# Patient Record
Sex: Female | Born: 1960 | Race: Black or African American | Hispanic: No | Smoking: Former smoker
Health system: Southern US, Community
[De-identification: ages and names within clinical notes are randomized; demographics above are authoritative.]

## PROBLEM LIST (undated history)

## (undated) DIAGNOSIS — D649 Anemia, unspecified: Secondary | ICD-10-CM

## (undated) DIAGNOSIS — I10 Essential (primary) hypertension: Secondary | ICD-10-CM

## (undated) DIAGNOSIS — D219 Benign neoplasm of connective and other soft tissue, unspecified: Secondary | ICD-10-CM

## (undated) HISTORY — PX: CHOLECYSTECTOMY: SHX55

## (undated) HISTORY — PX: APPENDECTOMY: SHX54

## (undated) HISTORY — PX: SHOULDER SURGERY: SHX246

## (undated) HISTORY — PX: COLONOSCOPY: SHX5424

---

## 2008-10-14 ENCOUNTER — Emergency Department (HOSPITAL_COMMUNITY): Admission: EM | Admit: 2008-10-14 | Discharge: 2008-10-14 | Payer: Self-pay | Admitting: Emergency Medicine

## 2008-10-15 ENCOUNTER — Encounter (INDEPENDENT_AMBULATORY_CARE_PROVIDER_SITE_OTHER): Payer: Self-pay | Admitting: *Deleted

## 2010-06-18 LAB — BASIC METABOLIC PANEL
BUN: 7 mg/dL (ref 6–23)
Creatinine, Ser: 0.95 mg/dL (ref 0.4–1.2)
GFR calc non Af Amer: >60 mL/min (ref >60)
Glucose, Bld: 113 mg/dL — ABNORMAL HIGH (ref 70–99)
Potassium: 3.3 mEq/L — ABNORMAL LOW (ref 3.5–5.1)

## 2010-06-18 LAB — CLOSTRIDIUM DIFFICILE EIA: C difficile Toxins A+B, EIA: NEGATIVE

## 2010-06-18 LAB — DIFFERENTIAL
Basophils Absolute: 0 10*3/uL (ref 0.0–0.1)
Eosinophils Absolute: 0 10*3/uL (ref 0.0–0.7)
Eosinophils Relative: 0 % (ref 0–5)
Lymphocytes Relative: 14 % (ref 12–46)
Neutrophils Relative %: 74 % (ref 43–77)

## 2010-06-18 LAB — URINE MICROSCOPIC-ADD ON

## 2010-06-18 LAB — URINALYSIS, ROUTINE W REFLEX MICROSCOPIC
Nitrite: POSITIVE — AB
Specific Gravity, Urine: >1.03 — ABNORMAL HIGH (ref 1.005–1.030)
Urobilinogen, UA: 0.2 mg/dL (ref 0.0–1.0)

## 2010-06-18 LAB — CBC
HCT: 40.3 % (ref 36.0–46.0)
Platelets: 168 10*3/uL (ref 150–400)
RDW: 12.8 % (ref 11.5–15.5)

## 2010-06-18 LAB — URINE CULTURE: Colony Count: 30000

## 2010-06-18 LAB — HEPATIC FUNCTION PANEL
ALT: 39 U/L — ABNORMAL HIGH (ref 0–35)
Albumin: 3.6 g/dL (ref 3.5–5.2)
Indirect Bilirubin: 0.5 mg/dL (ref 0.3–0.9)
Total Protein: 6.6 g/dL (ref 6.0–8.3)

## 2010-06-18 LAB — STOOL CULTURE

## 2010-06-18 LAB — LIPASE, BLOOD: Lipase: 13 U/L (ref 11–59)

## 2016-06-28 ENCOUNTER — Other Ambulatory Visit: Payer: Self-pay | Admitting: Obstetrics and Gynecology

## 2016-06-28 DIAGNOSIS — R928 Other abnormal and inconclusive findings on diagnostic imaging of breast: Secondary | ICD-10-CM

## 2016-06-30 ENCOUNTER — Ambulatory Visit
Admission: RE | Admit: 2016-06-30 | Discharge: 2016-06-30 | Disposition: A | Discharge status: Home / Self Care | Payer: BC Managed Care – PPO | Source: Ambulatory Visit | Attending: Obstetrics and Gynecology | Admitting: Obstetrics and Gynecology

## 2016-06-30 DIAGNOSIS — R928 Other abnormal and inconclusive findings on diagnostic imaging of breast: Secondary | ICD-10-CM

## 2017-04-13 ENCOUNTER — Telehealth: Payer: Self-pay | Admitting: Orthopedic Surgery

## 2017-04-13 NOTE — Telephone Encounter (Signed)
Patient called to inquire about appointment as a new patient, for bilateral shoulder problem; states seeing chiropractor (during past 3 months, with some relief, "then problem comes back again". Discussed appointment availability, and that notes regarding her recent treatment would be needed.  I had to call patient back, due to avoiding hold time for patient, received her voice mail, left message to return call.

## 2018-02-28 NOTE — Patient Instructions (Addendum)
Your procedure is scheduled on:  Monday, 12/30  Enter through the Main Entrance of [facility] at: 6 am  Pick up the phone at the desk and dial [phone].  Call this number if you have problems the morning of surgery: [phone].  Remember: Do NOT eat food or Do NOT drink clear liquids (including water) after midnight Sunday.  Take these medicines the morning of surgery with a SIP OF WATER: None  Brush your teeth on the day of surgery.  Stop herbal medications, vitamin supplements, Ibuprofen/NSAIDS at this time..  Do NOT wear jewelry (body piercing), metal hair clips/bobby pins, make-up, or nail polish. Do NOT wear lotions, powders, or perfumes.  You may wear deoderant. Do NOT shave for 48 hours prior to surgery. Do NOT bring valuables to the hospital.  Leave suitcase in car.  After surgery it may be brought to your room.  For patients admitted to the hospital, checkout time is 11:00 AM the day of discharge. Have a responsible adult drive you home and stay with you for 24 hours after your procedure.  Home with Daughter Rachel Dunn cell [phone].

## 2018-03-01 ENCOUNTER — Other Ambulatory Visit: Payer: Self-pay

## 2018-03-01 ENCOUNTER — Encounter (HOSPITAL_COMMUNITY): Payer: Self-pay

## 2018-03-01 ENCOUNTER — Encounter (HOSPITAL_COMMUNITY)
Admission: RE | Admit: 2018-03-01 | Discharge: 2018-03-01 | Disposition: A | Discharge status: Home / Self Care | Payer: BC Managed Care – PPO | Source: Ambulatory Visit | Attending: Obstetrics and Gynecology | Admitting: Obstetrics and Gynecology

## 2018-03-01 DIAGNOSIS — Z01812 Encounter for preprocedural laboratory examination: Secondary | ICD-10-CM | POA: Diagnosis not present

## 2018-03-01 HISTORY — DX: Benign neoplasm of connective and other soft tissue, unspecified: D21.9

## 2018-03-01 HISTORY — DX: Anemia, unspecified: D64.9

## 2018-03-01 HISTORY — DX: Essential (primary) hypertension: I10

## 2018-03-01 LAB — COMPREHENSIVE METABOLIC PANEL
ALT: 18 U/L (ref 0–44)
AST: 11 U/L — ABNORMAL LOW (ref 15–41)
Albumin: 4.2 g/dL (ref 3.5–5.0)
Alkaline Phosphatase: 76 U/L (ref 38–126)
Anion gap: 6 (ref 5–15)
BUN: 10 mg/dL (ref 6–20)
CO2: 22 mmol/L (ref 22–32)
Calcium: 9.3 mg/dL (ref 8.9–10.3)
Chloride: 108 mmol/L (ref 98–111)
Creatinine, Ser: 0.64 mg/dL (ref 0.44–1.00)
GFR calc non Af Amer: >60 mL/min (ref >60)
GLUCOSE: 92 mg/dL (ref 70–99)
Potassium: 3.8 mmol/L (ref 3.5–5.1)
Sodium: 136 mmol/L (ref 135–145)
Total Bilirubin: 0.7 mg/dL (ref 0.3–1.2)
Total Protein: 7 g/dL (ref 6.5–8.1)

## 2018-03-01 LAB — CBC
HCT: 32 % — ABNORMAL LOW (ref 36.0–46.0)
HEMOGLOBIN: 9.6 g/dL — AB (ref 12.0–15.0)
MCH: 23.5 pg — ABNORMAL LOW (ref 26.0–34.0)
MCHC: 30 g/dL (ref 30.0–36.0)
MCV: 78.4 fL — ABNORMAL LOW (ref 80.0–100.0)
Platelets: 204 10*3/uL (ref 150–400)
RBC: 4.08 MIL/uL (ref 3.87–5.11)
RDW: 14.2 % (ref 11.5–15.5)
WBC: 6.3 10*3/uL (ref 4.0–10.5)
nRBC: 0 % (ref 0.0–0.2)

## 2018-03-01 LAB — ABO/RH: ABO/RH(D): A POS

## 2018-03-01 LAB — TYPE AND SCREEN
ABO/RH(D): A POS
Antibody Screen: NEGATIVE

## 2018-03-10 NOTE — H&P (Addendum)
Rachel Dunn is an 57 y.o. female presents today for hysterectomy.  She has been having problems with menorrhagia and dysmenorrhea.  She does have a 12-week-sized fibroid uterus with a recent ultrasound showing 6.5 and 5.2 cm intramural fibroids distorting the endometrial cavity but there are no intracavitary masses noted.  She is perimenopausal and desires removal of both ovaries since she is 57 years old.  Previously has been on birth control pills but she stopped and cycles have been excessive and at this time desires surgical intervention.        Past Medical History:  Diagnosis Date  . Anemia   . Fibroids    uterine  . Hypertension     Past Surgical History:  Procedure Laterality Date  . APPENDECTOMY    . CESAREAN SECTION    . CHOLECYSTECTOMY    . COLONOSCOPY     hx polyp  . SHOULDER SURGERY Right     Family History  Problem Relation Age of Onset  . Breast cancer Maternal Aunt     Social History:  reports that she quit smoking about 21 years ago. Her smoking use included cigarettes. She has a 0.50 pack-year smoking history. She has never used smokeless tobacco. She reports current alcohol use of about 3.0 standard drinks of alcohol per week. She reports that she does not use drugs.  Allergies: No Known Allergies  No medications prior to admission.    ROS  There were no vitals taken for this visit. Physical Exam Blood pressure 124/82. Heart regular rate and rhythm.  Lungs clear to auscultation bilaterally.  Abdomen is nondistended and nontender.  Pfannenstiel skin incision well healed.  Uterus is anteverted, 12 weeks size, mobile, nontender.  No adnexal masses.   No results found for this or any previous visit (from the past 24 hour(s)).  No results found. Imp/Plan:   Uterine fibroids 12 weeks size and menorrhagia with dysmenorrhea desiring definitive surgical intervention, also with a previous history of cesarean section.  Discussed hysterectomy.  We will proceed  with an LAVH/BSO.  She understands we may need to convert to an abdominal hysterectomy.  We discussed the procedure, its risks and benefits, its recovery.  All of her questions were answered.   Louretta Shorten, MD   Luz Lex 03/10/2018, 9:31 PM   This patient has been seen and examined.   All of her questions were answered.  Labs and vital signs reviewed.  Informed consent has been obtained.  The History and Physical is current. DL

## 2018-03-10 NOTE — Anesthesia Preprocedure Evaluation (Addendum)
Anesthesia Evaluation  Patient identified by MRN, date of birth, ID band Patient awake    Reviewed: Allergy & Precautions, NPO status , Patient's Chart, lab work & pertinent test results  Airway Mallampati: I  TM Distance: >3 FB Neck ROM: Full    Dental no notable dental hx. (+) Teeth Intact, Dental Advisory Given   Pulmonary former smoker,    Pulmonary exam normal breath sounds clear to auscultation       Cardiovascular hypertension, Pt. on medications Normal cardiovascular exam Rhythm:Regular Rate:Normal     Neuro/Psych negative neurological ROS  negative psych ROS   GI/Hepatic negative GI ROS, Neg liver ROS,   Endo/Other  negative endocrine ROS  Renal/GU negative Renal ROS     Musculoskeletal negative musculoskeletal ROS (+)   Abdominal (+) + obese,   Peds  Hematology  (+) Blood dyscrasia, anemia ,   Anesthesia Other Findings   Reproductive/Obstetrics negative OB ROS                            Lab Results  Component Value Date   CREATININE 0.64 03/01/2018   BUN 10 03/01/2018   NA 136 03/01/2018   K 3.8 03/01/2018   CL 108 03/01/2018   CO2 22 03/01/2018    Lab Results  Component Value Date   WBC 6.3 03/01/2018   HGB 9.6 (L) 03/01/2018   HCT 32.0 (L) 03/01/2018   MCV 78.4 (L) 03/01/2018   PLT 204 03/01/2018    Anesthesia Physical Anesthesia Plan  ASA: III  Anesthesia Plan: General   Post-op Pain Management:    Induction: Intravenous  PONV Risk Score and Plan: 3 and Treatment may vary due to age or medical condition, Ondansetron and Dexamethasone  Airway Management Planned: Oral ETT  Additional Equipment:   Intra-op Plan:   Post-operative Plan: Extubation in OR  Informed Consent: I have reviewed the patients History and Physical, chart, labs and discussed the procedure including the risks, benefits and alternatives for the proposed anesthesia with the patient  or authorized representative who has indicated his/her understanding and acceptance.   Dental advisory given  Plan Discussed with: CRNA  Anesthesia Plan Comments:        Anesthesia Quick Evaluation

## 2018-03-11 ENCOUNTER — Observation Stay (HOSPITAL_COMMUNITY)
Admission: RE | Admit: 2018-03-11 | Discharge: 2018-03-12 | Disposition: A | Discharge status: Home / Self Care | Payer: BC Managed Care – PPO | Attending: Obstetrics and Gynecology | Admitting: Obstetrics and Gynecology

## 2018-03-11 ENCOUNTER — Encounter (HOSPITAL_COMMUNITY): Payer: Self-pay

## 2018-03-11 ENCOUNTER — Observation Stay (HOSPITAL_COMMUNITY): Payer: BC Managed Care – PPO | Admitting: Anesthesiology

## 2018-03-11 ENCOUNTER — Other Ambulatory Visit: Payer: Self-pay

## 2018-03-11 ENCOUNTER — Encounter (HOSPITAL_COMMUNITY)
Admission: RE | Disposition: A | Discharge status: Home / Self Care | Payer: Self-pay | Source: Home / Self Care | Attending: Obstetrics and Gynecology

## 2018-03-11 DIAGNOSIS — N8302 Follicular cyst of left ovary: Secondary | ICD-10-CM | POA: Diagnosis not present

## 2018-03-11 DIAGNOSIS — D251 Intramural leiomyoma of uterus: Secondary | ICD-10-CM | POA: Diagnosis present

## 2018-03-11 DIAGNOSIS — Z79899 Other long term (current) drug therapy: Secondary | ICD-10-CM | POA: Diagnosis not present

## 2018-03-11 DIAGNOSIS — Z90710 Acquired absence of both cervix and uterus: Secondary | ICD-10-CM | POA: Diagnosis present

## 2018-03-11 DIAGNOSIS — Z87891 Personal history of nicotine dependence: Secondary | ICD-10-CM | POA: Diagnosis not present

## 2018-03-11 DIAGNOSIS — N736 Female pelvic peritoneal adhesions (postinfective): Secondary | ICD-10-CM | POA: Insufficient documentation

## 2018-03-11 DIAGNOSIS — R102 Pelvic and perineal pain: Secondary | ICD-10-CM | POA: Diagnosis not present

## 2018-03-11 DIAGNOSIS — N8301 Follicular cyst of right ovary: Secondary | ICD-10-CM | POA: Diagnosis not present

## 2018-03-11 DIAGNOSIS — N920 Excessive and frequent menstruation with regular cycle: Secondary | ICD-10-CM | POA: Diagnosis present

## 2018-03-11 DIAGNOSIS — I10 Essential (primary) hypertension: Secondary | ICD-10-CM | POA: Diagnosis not present

## 2018-03-11 DIAGNOSIS — N946 Dysmenorrhea, unspecified: Secondary | ICD-10-CM | POA: Insufficient documentation

## 2018-03-11 HISTORY — PX: LAPAROSCOPY: SHX197

## 2018-03-11 HISTORY — PX: HYSTERECTOMY ABDOMINAL WITH SALPINGO-OOPHORECTOMY: SHX6792

## 2018-03-11 HISTORY — PX: LYSIS OF ADHESION: SHX5961

## 2018-03-11 SURGERY — LAPAROSCOPY, DIAGNOSTIC
Anesthesia: General

## 2018-03-11 MED ORDER — HYDROMORPHONE HCL 1 MG/ML IJ SOLN
0.2500 mg | INTRAMUSCULAR | Status: DC | PRN
  Administered 2018-03-11 (×2): 0.5 mg via INTRAVENOUS

## 2018-03-11 MED ORDER — ACETAMINOPHEN 500 MG PO TABS
ORAL_TABLET | ORAL | Status: AC
  Administered 2018-03-11: 1000 mg via ORAL
  Filled 2018-03-11: qty 2

## 2018-03-11 MED ORDER — HYDROMORPHONE 1 MG/ML IV SOLN
INTRAVENOUS | Status: DC
  Administered 2018-03-11: 0.2 mg via INTRAVENOUS
  Administered 2018-03-11: 1.8 mg via INTRAVENOUS
  Administered 2018-03-11: 30 mg via INTRAVENOUS
  Filled 2018-03-11: qty 30
  Filled 2018-03-11: qty 25

## 2018-03-11 MED ORDER — LIDOCAINE HCL (CARDIAC) PF 100 MG/5ML IV SOSY
PREFILLED_SYRINGE | INTRAVENOUS | Status: DC | PRN
  Administered 2018-03-11: 100 mg via INTRAVENOUS

## 2018-03-11 MED ORDER — ROCURONIUM BROMIDE 100 MG/10ML IV SOLN
INTRAVENOUS | Status: AC
  Filled 2018-03-11: qty 1

## 2018-03-11 MED ORDER — PROMETHAZINE HCL 25 MG/ML IJ SOLN: 6.2500 mg | INTRAMUSCULAR | Status: DC | PRN

## 2018-03-11 MED ORDER — HYDROCODONE-ACETAMINOPHEN 7.5-325 MG PO TABS: 1.0000 | ORAL_TABLET | Freq: Once | ORAL | Status: DC | PRN

## 2018-03-11 MED ORDER — BUPIVACAINE HCL (PF) 0.25 % IJ SOLN
INTRAMUSCULAR | Status: AC
  Filled 2018-03-11: qty 30

## 2018-03-11 MED ORDER — DIPHENHYDRAMINE HCL 12.5 MG/5ML PO ELIX: 12.5000 mg | ORAL_SOLUTION | Freq: Four times a day (QID) | ORAL | Status: DC | PRN

## 2018-03-11 MED ORDER — HYDROMORPHONE HCL 1 MG/ML IJ SOLN
INTRAMUSCULAR | Status: DC | PRN
  Administered 2018-03-11: 1 mg via INTRAVENOUS

## 2018-03-11 MED ORDER — HYDROMORPHONE HCL 1 MG/ML IJ SOLN
INTRAMUSCULAR | Status: AC
  Administered 2018-03-11: 0.5 mg via INTRAVENOUS
  Filled 2018-03-11: qty 0.5

## 2018-03-11 MED ORDER — SODIUM CHLORIDE 0.9 % IV SOLN
INTRAVENOUS | Status: AC
  Filled 2018-03-11: qty 2

## 2018-03-11 MED ORDER — OXYCODONE-ACETAMINOPHEN 5-325 MG PO TABS
1.0000 | ORAL_TABLET | ORAL | Status: DC | PRN
  Administered 2018-03-11 – 2018-03-12 (×4): 1 via ORAL
  Filled 2018-03-11 (×4): qty 1

## 2018-03-11 MED ORDER — FENTANYL CITRATE (PF) 100 MCG/2ML IJ SOLN
INTRAMUSCULAR | Status: DC | PRN
  Administered 2018-03-11 (×5): 50 ug via INTRAVENOUS

## 2018-03-11 MED ORDER — LIDOCAINE HCL (CARDIAC) PF 100 MG/5ML IV SOSY
PREFILLED_SYRINGE | INTRAVENOUS | Status: AC
  Filled 2018-03-11: qty 5

## 2018-03-11 MED ORDER — SCOPOLAMINE 1 MG/3DAYS TD PT72
1.0000 | MEDICATED_PATCH | Freq: Once | TRANSDERMAL | Status: DC
  Administered 2018-03-11: 1.5 mg via TRANSDERMAL

## 2018-03-11 MED ORDER — GLYCOPYRROLATE 0.2 MG/ML IJ SOLN
INTRAMUSCULAR | Status: DC | PRN
  Administered 2018-03-11: 0.1 mg via INTRAVENOUS

## 2018-03-11 MED ORDER — GABAPENTIN 300 MG PO CAPS
300.0000 mg | ORAL_CAPSULE | Freq: Once | ORAL | Status: AC
  Administered 2018-03-11: 300 mg via ORAL

## 2018-03-11 MED ORDER — LACTATED RINGERS IV SOLN
INTRAVENOUS | Status: DC
  Administered 2018-03-11: 09:00:00 via INTRAVENOUS
  Administered 2018-03-11: 125 mL/h via INTRAVENOUS

## 2018-03-11 MED ORDER — MEPERIDINE HCL 25 MG/ML IJ SOLN
INTRAMUSCULAR | Status: AC
  Filled 2018-03-11: qty 1

## 2018-03-11 MED ORDER — SODIUM CHLORIDE 0.9 % IV SOLN
2.0000 g | INTRAVENOUS | Status: AC
  Administered 2018-03-11: 2 g via INTRAVENOUS

## 2018-03-11 MED ORDER — HYDROMORPHONE HCL 1 MG/ML IJ SOLN
INTRAMUSCULAR | Status: AC
  Filled 2018-03-11: qty 0.5

## 2018-03-11 MED ORDER — ROCURONIUM BROMIDE 100 MG/10ML IV SOLN
INTRAVENOUS | Status: DC | PRN
  Administered 2018-03-11: 50 mg via INTRAVENOUS

## 2018-03-11 MED ORDER — SUGAMMADEX SODIUM 200 MG/2ML IV SOLN
INTRAVENOUS | Status: DC | PRN
  Administered 2018-03-11: 170 mg via INTRAVENOUS

## 2018-03-11 MED ORDER — SIMETHICONE 80 MG PO CHEW
80.0000 mg | CHEWABLE_TABLET | Freq: Four times a day (QID) | ORAL | Status: DC | PRN
  Administered 2018-03-11: 80 mg via ORAL
  Filled 2018-03-11: qty 1

## 2018-03-11 MED ORDER — ONDANSETRON HCL 4 MG/2ML IJ SOLN
4.0000 mg | Freq: Four times a day (QID) | INTRAMUSCULAR | Status: DC | PRN
  Administered 2018-03-11: 4 mg via INTRAVENOUS
  Filled 2018-03-11: qty 2

## 2018-03-11 MED ORDER — ALUM & MAG HYDROXIDE-SIMETH 200-200-20 MG/5ML PO SUSP: 30.0000 mL | ORAL | Status: DC | PRN

## 2018-03-11 MED ORDER — MIDAZOLAM HCL 2 MG/2ML IJ SOLN
INTRAMUSCULAR | Status: AC
  Filled 2018-03-11: qty 2

## 2018-03-11 MED ORDER — SUCCINYLCHOLINE CHLORIDE 20 MG/ML IJ SOLN
INTRAMUSCULAR | Status: DC | PRN
  Administered 2018-03-11: 50 mg via INTRAVENOUS

## 2018-03-11 MED ORDER — KETOROLAC TROMETHAMINE 30 MG/ML IJ SOLN: 30.0000 mg | Freq: Once | INTRAMUSCULAR | Status: DC | PRN

## 2018-03-11 MED ORDER — DEXTROSE-NACL 5-0.45 % IV SOLN
INTRAVENOUS | Status: DC
  Administered 2018-03-11 (×2): via INTRAVENOUS

## 2018-03-11 MED ORDER — DEXAMETHASONE SODIUM PHOSPHATE 10 MG/ML IJ SOLN
INTRAMUSCULAR | Status: AC
  Filled 2018-03-11: qty 1

## 2018-03-11 MED ORDER — NALOXONE HCL 0.4 MG/ML IJ SOLN: 0.4000 mg | INTRAMUSCULAR | Status: DC | PRN

## 2018-03-11 MED ORDER — MIDAZOLAM HCL 2 MG/2ML IJ SOLN
INTRAMUSCULAR | Status: DC | PRN
  Administered 2018-03-11: 1 mg via INTRAVENOUS

## 2018-03-11 MED ORDER — FENTANYL CITRATE (PF) 250 MCG/5ML IJ SOLN
INTRAMUSCULAR | Status: AC
  Filled 2018-03-11: qty 5

## 2018-03-11 MED ORDER — ONDANSETRON HCL 4 MG/2ML IJ SOLN
INTRAMUSCULAR | Status: AC
  Filled 2018-03-11: qty 2

## 2018-03-11 MED ORDER — GABAPENTIN 300 MG PO CAPS
ORAL_CAPSULE | ORAL | Status: AC
  Administered 2018-03-11: 300 mg via ORAL
  Filled 2018-03-11: qty 1

## 2018-03-11 MED ORDER — DEXAMETHASONE SODIUM PHOSPHATE 4 MG/ML IJ SOLN
INTRAMUSCULAR | Status: DC | PRN
  Administered 2018-03-11: 4 mg via INTRAVENOUS

## 2018-03-11 MED ORDER — MEPERIDINE HCL 25 MG/ML IJ SOLN: 6.2500 mg | INTRAMUSCULAR | Status: DC | PRN

## 2018-03-11 MED ORDER — MEPERIDINE HCL 25 MG/ML IJ SOLN
6.2500 mg | INTRAMUSCULAR | Status: DC | PRN
  Administered 2018-03-11: 12.5 mg via INTRAVENOUS

## 2018-03-11 MED ORDER — METOCLOPRAMIDE HCL 5 MG/ML IJ SOLN
INTRAMUSCULAR | Status: DC | PRN
  Administered 2018-03-11: 10 mg via INTRAVENOUS

## 2018-03-11 MED ORDER — PROPOFOL 10 MG/ML IV BOLUS
INTRAVENOUS | Status: DC | PRN
  Administered 2018-03-11: 150 mg via INTRAVENOUS

## 2018-03-11 MED ORDER — ACETAMINOPHEN 500 MG PO TABS
1000.0000 mg | ORAL_TABLET | Freq: Once | ORAL | Status: AC
  Administered 2018-03-11: 1000 mg via ORAL

## 2018-03-11 MED ORDER — ONDANSETRON HCL 4 MG/2ML IJ SOLN
4.0000 mg | Freq: Once | INTRAMUSCULAR | Status: AC | PRN
  Administered 2018-03-11: 4 mg via INTRAVENOUS

## 2018-03-11 MED ORDER — SCOPOLAMINE 1 MG/3DAYS TD PT72
MEDICATED_PATCH | TRANSDERMAL | Status: AC
  Administered 2018-03-11: 1.5 mg via TRANSDERMAL
  Filled 2018-03-11: qty 1

## 2018-03-11 MED ORDER — SODIUM CHLORIDE 0.9% FLUSH: 9.0000 mL | INTRAVENOUS | Status: DC | PRN

## 2018-03-11 MED ORDER — 0.9 % SODIUM CHLORIDE (POUR BTL) OPTIME
TOPICAL | Status: DC | PRN
  Administered 2018-03-11 (×2): 1000 mL

## 2018-03-11 MED ORDER — HYDROMORPHONE HCL 1 MG/ML IJ SOLN
INTRAMUSCULAR | Status: AC
  Filled 2018-03-11: qty 1

## 2018-03-11 MED ORDER — DIPHENHYDRAMINE HCL 50 MG/ML IJ SOLN: 12.5000 mg | Freq: Four times a day (QID) | INTRAMUSCULAR | Status: DC | PRN

## 2018-03-11 MED ORDER — BUPIVACAINE HCL (PF) 0.25 % IJ SOLN
INTRAMUSCULAR | Status: DC | PRN
  Administered 2018-03-11: 10 mL

## 2018-03-11 MED ORDER — METOCLOPRAMIDE HCL 5 MG/ML IJ SOLN
INTRAMUSCULAR | Status: AC
  Filled 2018-03-11: qty 2

## 2018-03-11 MED ORDER — HYDROCHLOROTHIAZIDE 12.5 MG PO CAPS
12.5000 mg | ORAL_CAPSULE | Freq: Every day | ORAL | Status: DC
  Administered 2018-03-11: 12.5 mg via ORAL
  Filled 2018-03-11 (×2): qty 1

## 2018-03-11 MED ORDER — ZOLPIDEM TARTRATE 5 MG PO TABS: 5.0000 mg | ORAL_TABLET | Freq: Every evening | ORAL | Status: DC | PRN

## 2018-03-11 MED ORDER — LOSARTAN POTASSIUM-HCTZ 100-12.5 MG PO TABS: 1.0000 | ORAL_TABLET | Freq: Every day | ORAL | Status: DC

## 2018-03-11 MED ORDER — MENTHOL 3 MG MT LOZG: 1.0000 | LOZENGE | OROMUCOSAL | Status: DC | PRN

## 2018-03-11 MED ORDER — HYDROMORPHONE HCL 1 MG/ML IJ SOLN
0.2500 mg | INTRAMUSCULAR | Status: DC | PRN
  Administered 2018-03-11: 0.5 mg via INTRAVENOUS

## 2018-03-11 MED ORDER — PROPOFOL 10 MG/ML IV BOLUS
INTRAVENOUS | Status: AC
  Filled 2018-03-11: qty 20

## 2018-03-11 MED ORDER — LOSARTAN POTASSIUM 50 MG PO TABS
100.0000 mg | ORAL_TABLET | Freq: Every day | ORAL | Status: DC
  Administered 2018-03-11: 100 mg via ORAL
  Filled 2018-03-11 (×2): qty 2

## 2018-03-11 MED ORDER — ACETAMINOPHEN 10 MG/ML IV SOLN: 1000.0000 mg | Freq: Once | INTRAVENOUS | Status: DC | PRN

## 2018-03-11 SURGICAL SUPPLY — 45 items
BLADE SURG 15 STRL LF C SS BP (BLADE) ×2 IMPLANT
BLADE SURG 15 STRL SS (BLADE) ×4
CATH ROBINSON RED A/P 16FR (CATHETERS) ×4 IMPLANT
CONT PATH 16OZ SNAP LID 3702 (MISCELLANEOUS) ×4 IMPLANT
COVER BACK TABLE 60X90IN (DRAPES) ×4 IMPLANT
COVER MAYO STAND STRL (DRAPES) ×4 IMPLANT
DURAPREP 26ML APPLICATOR (WOUND CARE) ×4 IMPLANT
ELECT REM PT RETURN 9FT ADLT (ELECTROSURGICAL) ×4
ELECTRODE REM PT RTRN 9FT ADLT (ELECTROSURGICAL) ×2 IMPLANT
FORCEPS CUTTING 45CM 5MM (CUTTING FORCEPS) ×4 IMPLANT
GLOVE BIO SURGEON STRL SZ8 (GLOVE) ×4 IMPLANT
GLOVE BIOGEL PI IND STRL 6.5 (GLOVE) ×2 IMPLANT
GLOVE BIOGEL PI IND STRL 7.0 (GLOVE) ×6 IMPLANT
GLOVE BIOGEL PI INDICATOR 6.5 (GLOVE) ×2
GLOVE BIOGEL PI INDICATOR 7.0 (GLOVE) ×6
GLOVE SURG ORTHO 8.0 STRL STRW (GLOVE) ×12 IMPLANT
HEMOSTAT ARISTA ABSORB 3G PWDR (MISCELLANEOUS) ×3 IMPLANT
LEGGING LITHOTOMY PAIR STRL (DRAPES) ×4 IMPLANT
LIGASURE IMPACT 36 18CM CVD LR (INSTRUMENTS) ×3 IMPLANT
NEEDLE INSUFFLATION 120MM (ENDOMECHANICALS) ×4 IMPLANT
NS IRRIG 1000ML POUR BTL (IV SOLUTION) ×4 IMPLANT
PACK LAVH (CUSTOM PROCEDURE TRAY) ×4 IMPLANT
PACK ROBOTIC GOWN (GOWN DISPOSABLE) ×4 IMPLANT
PACK TRENDGUARD 450 HYBRID PRO (MISCELLANEOUS) ×1 IMPLANT
PROTECTOR NERVE ULNAR (MISCELLANEOUS) ×8 IMPLANT
SPONGE LAP 18X18 X RAY DECT (DISPOSABLE) ×12 IMPLANT
SUT MNCRL 0 MO-4 VIOLET 18 CR (SUTURE) ×4 IMPLANT
SUT MNCRL 0 VIOLET 6X18 (SUTURE) ×2 IMPLANT
SUT MNCRL AB 3-0 PS2 27 (SUTURE) ×3 IMPLANT
SUT MONOCRYL 0 6X18 (SUTURE) ×2
SUT MONOCRYL 0 MO 4 18  CR/8 (SUTURE) ×4
SUT PLAIN 2 0 XLH (SUTURE) ×3 IMPLANT
SUT VIC AB 1 CTX 36 (SUTURE) ×8
SUT VIC AB 1 CTX36XBRD ANBCTRL (SUTURE) ×2 IMPLANT
SUT VIC AB 2-0 CT1 (SUTURE) ×3 IMPLANT
SUT VICRYL 0 UR6 27IN ABS (SUTURE) ×4 IMPLANT
SUT VICRYL RAPIDE 3 0 (SUTURE) ×4 IMPLANT
TOWEL OR 17X24 6PK STRL BLUE (TOWEL DISPOSABLE) ×8 IMPLANT
TRAY FOLEY W/BAG SLVR 14FR (SET/KITS/TRAYS/PACK) ×4 IMPLANT
TRENDGUARD 450 HYBRID PRO PACK (MISCELLANEOUS) ×4
TROCAR OPTI TIP 5M 100M (ENDOMECHANICALS) ×4 IMPLANT
TROCAR XCEL DIL TIP R 11M (ENDOMECHANICALS) ×4 IMPLANT
TUBING INSUF HEATED (TUBING) ×4 IMPLANT
WARMER LAPAROSCOPE (MISCELLANEOUS) ×4 IMPLANT
YANKAUER SUCT BULB TIP NO VENT (SUCTIONS) ×3 IMPLANT

## 2018-03-11 NOTE — Brief Op Note (Signed)
03/11/2018  8:59 AM  PATIENT:  Rachel Dunn  57 y.o. female  PRE-OPERATIVE DIAGNOSIS:  fibroids, menorrhagia  POST-OPERATIVE DIAGNOSIS:  fibroids, menorrhagia, pelvic adhesions  PROCEDURE:  Procedure(s): DIAGNOSTIC LAPAROSCOPY TOTAL ABDOMINAL HYSTERECTOMY WITH BILATERAL SALPINGO-OOPHORECTOMY LYSIS OF ADHESIONS  SURGEON:  Surgeon(s) and Role:    * Louretta Shorten, MD - Primary    * Arvella Nigh, MD - Assisting  PHYSICIAN ASSISTANT:   ASSISTANTS: McComb   ANESTHESIA:   local and general  EBL:  375 mL   BLOOD ADMINISTERED:none  DRAINS: Urinary Catheter (Foley)   LOCAL MEDICATIONS USED:  MARCAINE     SPECIMEN:  Source of Specimen:  Uterus tubes and ovaries  DISPOSITION OF SPECIMEN:  PATHOLOGY  COUNTS:  YES  TOURNIQUET:  * No tourniquets in log *  DICTATION: .Other Dictation: Dictation Number 1  PLAN OF CARE: Admit for overnight observation  PATIENT DISPOSITION:  PACU - hemodynamically stable.   Delay start of Pharmacological VTE agent (>24hrs) due to surgical blood loss or risk of bleeding: not applicable

## 2018-03-11 NOTE — Anesthesia Postprocedure Evaluation (Signed)
Anesthesia Post Note  Patient: Rachel Dunn  Procedure(s) Performed: DIAGNOSTIC LAPAROSCOPY TOTAL ABDOMINAL HYSTERECTOMY WITH BILATERAL SALPINGO-OOPHORECTOMY LYSIS OF ADHESIONS     Patient location during evaluation: PACU Anesthesia Type: General Level of consciousness: awake and alert Pain management: pain level controlled Vital Signs Assessment: post-procedure vital signs reviewed and stable Respiratory status: spontaneous breathing, nonlabored ventilation, respiratory function stable and patient connected to nasal cannula oxygen Cardiovascular status: blood pressure returned to baseline and stable Postop Assessment: no apparent nausea or vomiting Anesthetic complications: no    Last Vitals:  Vitals:   03/11/18 1316 03/11/18 1406  BP:  111/65  Pulse:  (!) 53  Resp: 15 16  Temp:  36.8 C  SpO2: 100% 97%    Last Pain:  Vitals:   03/11/18 1316  TempSrc:   PainSc: 0-No pain   Pain Goal: Patients Stated Pain Goal: 3 (03/11/18 1200)               Barnet Glasgow

## 2018-03-11 NOTE — Anesthesia Procedure Notes (Signed)
Procedure Name: Intubation Date/Time: 03/11/2018 7:36 AM Performed by: Flossie Dibble, CRNA Pre-anesthesia Checklist: Patient identified, Patient being monitored, Timeout performed, Emergency Drugs available and Suction available Patient Re-evaluated:Patient Re-evaluated prior to induction Oxygen Delivery Method: Circle System Utilized Preoxygenation: Pre-oxygenation with 100% oxygen Induction Type: IV induction Ventilation: Mask ventilation without difficulty and Oral airway inserted - appropriate to patient size Laryngoscope Size: Mac and 3 Grade View: Grade II Tube type: Oral Tube size: 7.0 mm Number of attempts: 1 Airway Equipment and Method: stylet Placement Confirmation: ETT inserted through vocal cords under direct vision,  positive ETCO2 and breath sounds checked- equal and bilateral Secured at: 21.5 cm Tube secured with: Tape Dental Injury: Teeth and Oropharynx as per pre-operative assessment

## 2018-03-11 NOTE — Transfer of Care (Signed)
Immediate Anesthesia Transfer of Care Note  Patient: Rachel Dunn  Procedure(s) Performed: DIAGNOSTIC LAPAROSCOPY TOTAL ABDOMINAL HYSTERECTOMY WITH BILATERAL SALPINGO-OOPHORECTOMY LYSIS OF ADHESIONS  Patient Location: PACU  Anesthesia Type:General  Level of Consciousness: awake, alert  and oriented  Airway & Oxygen Therapy: Patient Spontanous Breathing and Patient connected to nasal cannula oxygen  Post-op Assessment: Report given to RN and Post -op Vital signs reviewed and stable  Post vital signs: Reviewed and stable  Last Vitals:  Vitals Value Taken Time  BP    Temp    Pulse 60 03/11/2018  9:19 AM  Resp 15 03/11/2018  9:19 AM  SpO2 100 % 03/11/2018  9:19 AM  Vitals shown include unvalidated device data.  Last Pain:  Vitals:   03/11/18 0602  TempSrc: Oral  PainSc: 0-No pain      Patients Stated Pain Goal: 5 (66/06/30 1601)  Complications: No apparent anesthesia complications

## 2018-03-11 NOTE — Op Note (Signed)
Rachel Dunn, Rachel Dunn MEDICAL RECORD [id number] ACCOUNT [id number] DATE OF BIRTH:1960/10/19 FACILITY: [facility] LOCATION: [facility] PHYSICIAN:Rhylie Stehr Dominic Pea, MD  OPERATIVE REPORT  DATE OF PROCEDURE:  03/11/2018  PREOPERATIVE DIAGNOSES:  Pelvic fibroids, leiomyomata, menorrhagia, dysmenorrhea.  POSTOPERATIVE DIAGNOSES:  Pelvic fibroids, leiomyomata, menorrhagia, dysmenorrhea plus pelvic adhesions.  PROCEDURES:  Diagnostic laparoscopy, abdominal hysterectomy, bilateral salpingo-oophorectomy, lysis of adhesions.  SURGEON:  Louretta Shorten, MD  ASSISTANT:  Mickey Farber, M.D.  ANESTHESIA:  General endotracheal and local.  INDICATIONS:  The patient is a 57 year old with a 12-week size fibroid.  She has had worsening menorrhagia and dysmenorrhea.  Recent ultrasound shows a 6.5 cm and 5.2 cm intramural fibroids distorting the endometrial cavity.  She has a history of a  previous ruptured appendix requiring surgery and also cesarean section.  She has excessive bleeding and pelvic pain and pressure and desires a hysterectomy with removal of the ovaries at this time.  Risks and benefits were discussed at length and  informed consent was obtained.  FINDINGS:  At time of surgery were a 12 week size fibroid uterus encased with omentum and bowel adhesions.  Normal appearing ovaries.  DESCRIPTION OF PROCEDURE:  After adequate analgesia, the patient was placed in the dorsal lithotomy position.  She was sterilely prepped and draped.  Bladder was sterilely drained.  A weighted speculum was placed.  Tenaculum placed on the anterior lip of  the cervix and a Cohen tenaculum was then placed.  A 1 cm infraumbilical skin incision was made.  A Veress needle was inserted.  The abdomen was insufflated and dullness to percussion.  The 11 mm trocar was inserted and the above findings were noted.   The 5 mm incision was made 2 fingerbreadths above the pubic symphysis and a 5 mm port placed under direct visualization to help  with evaluation due to extreme adhesions encasing the uterus, tubes and ovaries including omentum and bowel.  Decision was  made to proceed with abdominal hysterectomy.  The laparoscope was removed from the ports.  The 5 mm site was closed with 0 Vicryl interrupted suture in the fascia, 3-0 Vicryl Rapide subcuticular suture.  The previous Pfannenstiel skin incision was used  as a landmark and was incised transversely and extended superiorly and inferiorly off the bellies of the rectus muscle, which were separated sharply in the midline.  Peritoneum was entered sharply.  Careful dissection of the omentum and bowel off the  anterior surface of the uterus was carried out.  The bowel was packed cephalad and O'Connor-O'Sullivan retractor placed.  A thyroid tenaculum placed on the anterior surface of the uterus and it was elevated.  Further omental and bowel adhesions were  removed with sharp dissection and Bovie cautery.  Care was taken to achieve good hemostasis and avoiding injury to the bowel.  After the omentum and bowel had been removed from the uterus, the left uteroovarian ligament was identified, suture ligated  with LigaSure instrument dissector across the uteroovarian ligament, across the round ligament to the uterine vasculature, which was coagulated and then dissected.  This was then simulated along the right side across the uteroovarian ligament,  infundibulopelvic ligament and  across the round ligament similarly to the left side, down to the uterine vasculature.  Ureter was identified away from the line of dissection.  No intraperistalsis noted bilaterally.  The bladder was dissected off the  anterior surface of the cervix.  The LigaSure instrument was used to ligate across the cardinal ligaments down to the uterosacral ligaments bilaterally.  Uterosacral ligaments were then clamped with Heaney clamps.  The vagina was opened and the uterus  and cervix were removed.  The cervix was noted to be  intact and was passed off the field.  The uterosacral ligaments were ligated bilaterally.  The vagina was closed using figure-of-eights of 0 Monocryl suture with good approximation and good hemostasis  was noted.  The uterosacral ligaments were then plicated in the midline.  After copious amount of irrigation,  adequate hemostasis I was assured small bleeding noted from the back of the bladder due to adhesions, Arista was applied with good hemostasis  noted.  Examination of the pedicles revealed good hemostasis and good peristalsis of the ureters noted bilaterally.  The peritoneum was then closed with 2-0 Vicryl.  Rectus muscles were plicated in the midline.  Some rectus bleeding due to a history of  adhesions.  Arista applied.  Good hemostasis was noted.  The fascia was then closed with a running suture of 0 Vicryl.  The skin was then closed with 4-0 Monocryl suture with good approximation and good hemostasis noted.  The incisions were injected with  0.25% Marcaine, total of 10 mL used.    The patient was stable, transferred to Recovery Room.    Sponge and needle count was normal x3.    Estimated blood loss 325 mL.    The uterus weighed 414 grams.    The patient received 2 grams of cefotetan preoperatively.  AN/NUANCE  D:03/11/2018 T:03/11/2018 JOB:[id number]

## 2018-03-12 ENCOUNTER — Encounter (HOSPITAL_COMMUNITY): Payer: Self-pay | Admitting: Obstetrics and Gynecology

## 2018-03-12 DIAGNOSIS — D251 Intramural leiomyoma of uterus: Secondary | ICD-10-CM | POA: Diagnosis not present

## 2018-03-12 LAB — CBC
HEMATOCRIT: 28.3 % — AB (ref 36.0–46.0)
Hemoglobin: 8.6 g/dL — ABNORMAL LOW (ref 12.0–15.0)
MCH: 23.8 pg — ABNORMAL LOW (ref 26.0–34.0)
MCHC: 30.4 g/dL (ref 30.0–36.0)
MCV: 78.4 fL — ABNORMAL LOW (ref 80.0–100.0)
Platelets: 209 10*3/uL (ref 150–400)
RBC: 3.61 MIL/uL — ABNORMAL LOW (ref 3.87–5.11)
RDW: 14.4 % (ref 11.5–15.5)
WBC: 11 10*3/uL — ABNORMAL HIGH (ref 4.0–10.5)
nRBC: 0 % (ref 0.0–0.2)

## 2018-03-12 MED ORDER — IBUPROFEN 600 MG PO TABS
600.0000 mg | ORAL_TABLET | Freq: Four times a day (QID) | ORAL | Status: DC
  Administered 2018-03-12 (×2): 600 mg via ORAL
  Filled 2018-03-12 (×2): qty 1

## 2018-03-12 MED ORDER — OXYCODONE-ACETAMINOPHEN 5-325 MG PO TABS: 1.0000 | ORAL_TABLET | ORAL | 0 refills | Status: DC | PRN

## 2018-03-12 MED ORDER — IBUPROFEN 600 MG PO TABS: 600.0000 mg | ORAL_TABLET | Freq: Four times a day (QID) | ORAL | 0 refills | Status: DC | PRN

## 2018-03-12 NOTE — Anesthesia Postprocedure Evaluation (Signed)
Anesthesia Post Note  Patient: Rachel Dunn  Procedure(s) Performed: DIAGNOSTIC LAPAROSCOPY TOTAL ABDOMINAL HYSTERECTOMY WITH BILATERAL SALPINGO-OOPHORECTOMY LYSIS OF ADHESIONS     Patient location during evaluation: [facility] Anesthesia Type: General Level of consciousness: awake and alert Pain management: satisfactory to patient Vital Signs Assessment: post-procedure vital signs reviewed and stable Respiratory status: spontaneous breathing and respiratory function stable Cardiovascular status: stable Postop Assessment: adequate PO intake Anesthetic complications: no    Last Vitals:  Vitals:   03/11/18 1928 03/11/18 2300  BP: 119/60 (!) 113/55  Pulse: (!) 58 (!) 59  Resp: 17 17  Temp: 37 C 36.8 C  SpO2: 100% 99%    Last Pain:  Vitals:   03/12/18 0540  TempSrc:   PainSc: 4    Pain Goal: Patients Stated Pain Goal: 3 (03/11/18 1820)               Katherina Mires

## 2018-03-12 NOTE — Addendum Note (Signed)
Addendum  created 03/12/18 0754 by Flossie Dibble, CRNA   Clinical Note Signed

## 2018-03-12 NOTE — Progress Notes (Signed)
1 Day Post-Op Procedure(s): DIAGNOSTIC LAPAROSCOPY TOTAL ABDOMINAL HYSTERECTOMY WITH BILATERAL SALPINGO-OOPHORECTOMY LYSIS OF ADHESIONS  Subjective: Patient reports tolerating PO and no problems voiding.    Objective: I have reviewed patient's vital signs, intake and output, medications and labs.  General: alert, cooperative, appears stated age and no distress GI: soft, non-tender; bowel sounds normal; no masses,  no organomegaly and incision: clean, dry and intact    Assessment: s/p Procedure(s): DIAGNOSTIC LAPAROSCOPY TOTAL ABDOMINAL HYSTERECTOMY WITH BILATERAL SALPINGO-OOPHORECTOMY LYSIS OF ADHESIONS: stable, progressing well and tolerating diet  Plan: Advance diet Encourage ambulation Advance to PO medication Discontinue IV fluids  LOS: 0 days    Luz Lex 03/12/2018, 8:59 AM

## 2018-03-12 NOTE — Progress Notes (Signed)
Patient discharged home with family. Prescriptions reviewed; Admission discussed; Medications discussed; Discharge instructions reviewed; Follow-up care reviewed; Patient/caregiver verbalized understanding; Pain management discussed. Pt verbalized understanding.

## 2018-03-12 NOTE — Discharge Summary (Signed)
Physician Discharge Summary  Patient ID: Rachel Dunn MRN: [id number] DOB/AGE: 08-17-1960 57 y.o.  Admit date: 03/11/2018 Discharge date: 03/12/2018  Admission Diagnoses:12 weeks size fibroids, menorrhagia, dysmenorrhea, Anemia  Discharge Diagnoses: same plus pelvic adhesions Active Problems:   S/P laparoscopic assisted vaginal hysterectomy (LAVH)   S/P TAH (total abdominal hysterectomy)   Discharged Condition: good  Hospital Course: Pt underwent Dx laparoscopy, TAH,BSO which was uncomplicated.  Her post op recovery was uneventful with quick return of bowel and bladder function, and POD1 ambulating without difficulty and pain controlled on ibuprofen.  She desired d/c home  Consults: None  Significant Diagnostic Studies: labs: posop op Hgb 8.6  Treatments: surgery: Dx lap, TAH, BSO  Discharge Exam: Blood pressure 105/60, pulse 76, temperature 97.7 F (36.5 C), temperature source Oral, resp. rate 18, height 5' 6.5" (1.689 m), weight 84.1 kg, SpO2 100 %. General appearance: alert, cooperative, appears stated age and no distress GI: soft, non-tender; bowel sounds normal; no masses,  no organomegaly Incision/Wound:CD&I  Disposition: Discharge disposition: 01-Home or Self Care       Discharge Instructions    Call MD for:  difficulty breathing, headache or visual disturbances   Complete by:  As directed    Call MD for:  persistant nausea and vomiting   Complete by:  As directed    Call MD for:  redness, tenderness, or signs of infection (pain, swelling, redness, odor or green/yellow discharge around incision site)   Complete by:  As directed    Call MD for:  severe uncontrolled pain   Complete by:  As directed    Call MD for:  temperature >100.4   Complete by:  As directed    Diet general   Complete by:  As directed    Driving Restrictions   Complete by:  As directed    No driving for 2 weeks   Increase activity slowly   Complete by:  As directed    Lifting  restrictions   Complete by:  As directed    No lifting anything greater than 10 pounds (if you have to ask, don't lift it)   Sexual Activity Restrictions   Complete by:  As directed    Nothing in the vagina for 6 weeks     Allergies as of 03/12/2018   No Known Allergies     Medication List    TAKE these medications   ibuprofen 600 MG tablet Commonly known as:  ADVIL,MOTRIN Take 1 tablet (600 mg total) by mouth every 6 (six) hours as needed.   losartan-hydrochlorothiazide 100-12.5 MG tablet Commonly known as:  HYZAAR Take 1 tablet by mouth at bedtime.   oxyCODONE-acetaminophen 5-325 MG tablet Commonly known as:  PERCOCET/ROXICET Take 1-2 tablets by mouth every 4 (four) hours as needed for moderate pain.        Signed: Luz Lex 03/12/2018, 5:23 PM

## 2018-09-10 ENCOUNTER — Other Ambulatory Visit: Payer: Self-pay

## 2018-09-10 ENCOUNTER — Other Ambulatory Visit: Payer: BC Managed Care – PPO

## 2018-09-10 DIAGNOSIS — Z20822 Contact with and (suspected) exposure to covid-19: Secondary | ICD-10-CM

## 2018-09-13 LAB — NOVEL CORONAVIRUS, NAA: SARS-CoV-2, NAA: NOT DETECTED

## 2019-01-23 ENCOUNTER — Encounter (HOSPITAL_COMMUNITY): Payer: Self-pay | Admitting: Emergency Medicine

## 2019-01-23 ENCOUNTER — Emergency Department (HOSPITAL_COMMUNITY)
Admission: EM | Admit: 2019-01-23 | Discharge: 2019-01-23 | Disposition: A | Discharge status: Home / Self Care | Payer: BC Managed Care – PPO | Attending: Emergency Medicine | Admitting: Emergency Medicine

## 2019-01-23 ENCOUNTER — Other Ambulatory Visit: Payer: Self-pay

## 2019-01-23 ENCOUNTER — Emergency Department (HOSPITAL_COMMUNITY): Payer: BC Managed Care – PPO

## 2019-01-23 DIAGNOSIS — K219 Gastro-esophageal reflux disease without esophagitis: Secondary | ICD-10-CM | POA: Diagnosis not present

## 2019-01-23 DIAGNOSIS — Z87891 Personal history of nicotine dependence: Secondary | ICD-10-CM | POA: Insufficient documentation

## 2019-01-23 DIAGNOSIS — R748 Abnormal levels of other serum enzymes: Secondary | ICD-10-CM | POA: Diagnosis not present

## 2019-01-23 DIAGNOSIS — R0789 Other chest pain: Secondary | ICD-10-CM

## 2019-01-23 DIAGNOSIS — I10 Essential (primary) hypertension: Secondary | ICD-10-CM | POA: Diagnosis not present

## 2019-01-23 LAB — CBC
HCT: 45.9 % (ref 36.0–46.0)
Hemoglobin: 14.9 g/dL (ref 12.0–15.0)
MCH: 29.6 pg (ref 26.0–34.0)
MCHC: 32.5 g/dL (ref 30.0–36.0)
MCV: 91.1 fL (ref 80.0–100.0)
Platelets: 208 10*3/uL (ref 150–400)
RBC: 5.04 MIL/uL (ref 3.87–5.11)
RDW: 12.5 % (ref 11.5–15.5)
WBC: 6.7 10*3/uL (ref 4.0–10.5)
nRBC: 0 % (ref 0.0–0.2)

## 2019-01-23 LAB — HEPATIC FUNCTION PANEL
ALT: 19 U/L (ref 0–44)
AST: 11 U/L — ABNORMAL LOW (ref 15–41)
Albumin: 4.3 g/dL (ref 3.5–5.0)
Alkaline Phosphatase: 128 U/L — ABNORMAL HIGH (ref 38–126)
Bilirubin, Direct: <0.1 mg/dL (ref 0.0–0.2)
Total Bilirubin: 0.6 mg/dL (ref 0.3–1.2)
Total Protein: 7.2 g/dL (ref 6.5–8.1)

## 2019-01-23 LAB — BASIC METABOLIC PANEL
Anion gap: 11 (ref 5–15)
BUN: 10 mg/dL (ref 6–20)
CO2: 26 mmol/L (ref 22–32)
Calcium: 10.3 mg/dL (ref 8.9–10.3)
Chloride: 105 mmol/L (ref 98–111)
Creatinine, Ser: 0.8 mg/dL (ref 0.44–1.00)
GFR calc Af Amer: >60 mL/min (ref >60)
GFR calc non Af Amer: >60 mL/min (ref >60)
Glucose, Bld: 110 mg/dL — ABNORMAL HIGH (ref 70–99)
Potassium: 4.4 mmol/L (ref 3.5–5.1)
Sodium: 142 mmol/L (ref 135–145)

## 2019-01-23 LAB — LIPASE, BLOOD: Lipase: 26 U/L (ref 11–51)

## 2019-01-23 LAB — TROPONIN I (HIGH SENSITIVITY)
Troponin I (High Sensitivity): <2 ng/L (ref <18)
Troponin I (High Sensitivity): <2 ng/L (ref <18)

## 2019-01-23 LAB — D-DIMER, QUANTITATIVE: D-Dimer, Quant: 0.3 ug/mL-FEU (ref 0.00–0.50)

## 2019-01-23 MED ORDER — FAMOTIDINE 20 MG PO TABS
20.0000 mg | ORAL_TABLET | Freq: Once | ORAL | Status: AC
  Administered 2019-01-23: 20 mg via ORAL
  Filled 2019-01-23: qty 1

## 2019-01-23 MED ORDER — SODIUM CHLORIDE 0.9% FLUSH: 3.0000 mL | Freq: Once | INTRAVENOUS | Status: DC

## 2019-01-23 MED ORDER — FAMOTIDINE 20 MG PO TABS: 20.0000 mg | ORAL_TABLET | Freq: Two times a day (BID) | ORAL | 0 refills | Status: DC

## 2019-01-23 MED ORDER — ALUM & MAG HYDROXIDE-SIMETH 200-200-20 MG/5ML PO SUSP
30.0000 mL | Freq: Once | ORAL | Status: AC
  Administered 2019-01-23: 30 mL via ORAL
  Filled 2019-01-23: qty 30

## 2019-01-23 NOTE — Discharge Instructions (Signed)
Take pepcid as prescribed.   Please follow up with your primary care provider within 5-7 days for re-evaluation of your symptoms. If you do not have a primary care provider, information for a healthcare clinic has been provided for you to make arrangements for follow up care. Please return to the emergency department for any new or worsening symptoms.

## 2019-01-23 NOTE — ED Provider Notes (Signed)
[facility] EMERGENCY DEPARTMENT Provider Note   CSN: [id number] Arrival date & time: 01/23/19  7124     History   Chief Complaint Chief Complaint  Patient presents with  . Chest Pain    HPI Rachel Dunn is a 58 y.o. female.     HPI   Pt is a 58 y/o female with a h/o anemia, fibroids, HTN, cholecystectomy, appendectomy, who presents to the ED today for eval of chest pain that woke her up around 400AM. Pain located to the mid chest and seems to radiate to her back. Pain has been intermittent since onset. She states that it seems like when she eats and lays down the pain is worse. It also seems to be worse with fried/fatty foods. Last night she had fried trout. She reports a burning sensation consistent with heartburn as well. Pain is improved with movement/exertion.   She has had similar pain intermittently for the last 1-2 months but it has not lasted this long. In the past she has taken gasx and it has improved the pain. She has not tried that today.   Denies abd pain, nausea, sob, lightheadedness.  Denies pain with inspiration. Denies leg pain/swelling, hemoptysis, recent surgery/trauma, recent long travel, hormone use, personal hx of cancer, or hx of DVT/PE.   Has a h/o tobacco use for about 2-3 years but quit 10 years ago. Denies h/o DM or HLD. No early fam hx of heart disease.   Past Medical History:  Diagnosis Date  . Anemia   . Fibroids    uterine  . Hypertension     Patient Active Problem List   Diagnosis Date Noted  . S/P laparoscopic assisted vaginal hysterectomy (LAVH) 03/11/2018  . S/P TAH (total abdominal hysterectomy) 03/11/2018    Past Surgical History:  Procedure Laterality Date  . APPENDECTOMY    . CESAREAN SECTION    . CHOLECYSTECTOMY    . COLONOSCOPY     hx polyp  . HYSTERECTOMY ABDOMINAL WITH SALPINGO-OOPHORECTOMY  03/11/2018   Procedure: TOTAL ABDOMINAL HYSTERECTOMY WITH BILATERAL SALPINGO-OOPHORECTOMY;  Surgeon: Louretta Shorten, MD;  Location: [facility];  Service: Gynecology;;  . LAPAROSCOPY  03/11/2018   Procedure: DIAGNOSTIC LAPAROSCOPY;  Surgeon: Louretta Shorten, MD;  Location: [facility];  Service: Gynecology;;  . Serita Kyle OF ADHESION  03/11/2018   Procedure: LYSIS OF ADHESIONS;  Surgeon: Louretta Shorten, MD;  Location: [facility];  Service: Gynecology;;  . SHOULDER SURGERY Right      OB History   No obstetric history on file.      Home Medications    Prior to Admission medications   Medication Sig Start Date End Date Taking? Authorizing Provider  famotidine (PEPCID) 20 MG tablet Take 1 tablet (20 mg total) by mouth 2 (two) times daily. 01/23/19 02/22/19  Deona Novitski S, PA-C  ibuprofen (ADVIL,MOTRIN) 600 MG tablet Take 1 tablet (600 mg total) by mouth every 6 (six) hours as needed. 03/12/18   Louretta Shorten, MD  losartan-hydrochlorothiazide (HYZAAR) 100-12.5 MG tablet Take 1 tablet by mouth at bedtime.    [provider]  oxyCODONE-acetaminophen (PERCOCET/ROXICET) 5-325 MG tablet Take 1-2 tablets by mouth every 4 (four) hours as needed for moderate pain. 03/12/18   Louretta Shorten, MD    Family History Family History  Problem Relation Age of Onset  . Breast cancer Maternal Aunt     Social History Social History   Tobacco Use  . Smoking status: Former Smoker    Packs/day: 0.25  Years: 2.00    Pack years: 0.50    Types: Cigarettes    Quit date: 03/13/1997    Years since quitting: 21.8  . Smokeless tobacco: Never Used  Substance Use Topics  . Alcohol use: Yes    Alcohol/week: 3.0 standard drinks    Types: 3 Cans of beer per week  . Drug use: Never     Allergies   Patient has no known allergies.   Review of Systems Review of Systems  Constitutional: Negative for chills and fever.  HENT: Negative for ear pain and sore throat.   Eyes: Negative for visual disturbance.  Respiratory: Negative for cough and shortness of breath.   Cardiovascular: Positive for chest pain.  Gastrointestinal:  Negative for abdominal pain, constipation, diarrhea, nausea and vomiting.  Genitourinary: Negative for dysuria and hematuria.  Musculoskeletal: Negative for back pain.  Skin: Negative for color change and rash.  Neurological: Negative for seizures and syncope.  All other systems reviewed and are negative.    Physical Exam Updated Vital Signs BP (!) 152/113 (BP Location: Left Arm)   Pulse (!) 106   Temp 98.8 F (37.1 C) (Oral)   Resp 16   Wt 81.6 kg   SpO2 100%   BMI 28.62 kg/m   Physical Exam Vitals signs and nursing note reviewed.  Constitutional:      General: She is not in acute distress.    Appearance: She is well-developed.  HENT:     Head: Normocephalic and atraumatic.  Eyes:     Conjunctiva/sclera: Conjunctivae normal.  Neck:     Musculoskeletal: Neck supple.  Cardiovascular:     Rate and Rhythm: Normal rate and regular rhythm.     Heart sounds: No murmur.  Pulmonary:     Effort: Pulmonary effort is normal. No respiratory distress.     Breath sounds: Normal breath sounds. No decreased breath sounds, wheezing, rhonchi or rales.  Chest:     Chest wall: No tenderness.  Abdominal:     Palpations: Abdomen is soft.     Tenderness: There is abdominal tenderness (epigastric).  Musculoskeletal:     Right lower leg: She exhibits no tenderness. No edema.     Left lower leg: She exhibits no tenderness. No edema.  Skin:    General: Skin is warm and dry.  Neurological:     Mental Status: She is alert.      ED Treatments / Results  Labs (all labs ordered are listed, but only abnormal results are displayed) Labs Reviewed  BASIC METABOLIC PANEL - Abnormal; Notable for the following components:      Result Value   Glucose, Bld 110 (*)    All other components within normal limits  HEPATIC FUNCTION PANEL - Abnormal; Notable for the following components:   AST 11 (*)    Alkaline Phosphatase 128 (*)    All other components within normal limits  CBC  LIPASE, BLOOD   D-DIMER, QUANTITATIVE (NOT AT [facility])  TROPONIN I (HIGH SENSITIVITY)  TROPONIN I (HIGH SENSITIVITY)    EKG EKG Interpretation  Date/Time:  Thursday January 23 2019 09:43:20 EST Ventricular Rate:  108 PR Interval:  186 QRS Duration: 72 QT Interval:  348 QTC Calculation: 466 R Axis:   34 Text Interpretation: Sinus tachycardia Septal infarct , age undetermined Abnormal ECG qwave in v2 new from first prior ekg of 01 March 2018 rate has increased Confirmed by Pattricia Boss [phone]) on 01/23/2019 9:58:45 AM   Radiology Dg Chest 2 View  Result Date: 01/23/2019 CLINICAL DATA:  Chest pain EXAM: CHEST - 2 VIEW COMPARISON:  None. FINDINGS: Normal heart size. Normal mediastinal contour. No pneumothorax. No pleural effusion. Lungs appear clear, with no acute consolidative airspace disease and no pulmonary edema. Cholecystectomy clips are seen in the right upper quadrant of the abdomen. IMPRESSION: No active cardiopulmonary disease. Electronically Signed   By: Ilona Sorrel M.D.   On: 01/23/2019 10:22    Procedures Procedures (including critical care time)  Medications Ordered in ED Medications  sodium chloride flush (NS) 0.9 % injection 3 mL (has no administration in time range)  alum & mag hydroxide-simeth (MAALOX/MYLANTA) 200-200-20 MG/5ML suspension 30 mL (30 mLs Oral Given 01/23/19 1332)  famotidine (PEPCID) tablet 20 mg (20 mg Oral Given 01/23/19 1329)     Initial Impression / Assessment and Plan / ED Course  I have reviewed the triage vital signs and the nursing notes.  Pertinent labs & imaging results that were available during my care of the patient were reviewed by me and considered in my medical decision making (see chart for details).   Final Clinical Impressions(s) / ED Diagnoses   Final diagnoses:  Atypical chest pain  Gastroesophageal reflux disease, unspecified whether esophagitis present  Elevated alkaline phosphatase level   58 year old female presenting for  evaluation of chest pain.  States pain radiates to her mid back.  She has no associated shortness of breath.    CBC without leukocytosis or anemia BMP with normal electrolytes and kidney function Liver function normal, alk phos elevated Lipase negative D-dimer negative Delta troponin is negative  EKG with Sinus tachycardia Septal infarct , age undetermined Abnormal ECG qwave in v2 new from first prior ekg of 01 March 2018 rate has increased   Chest x-ray without acute findings  Patient symptoms do not seem typical of ACS.  They improve with exertion and seem to be more associated with food and consistent with acid reflux.  Heart score 3.  D-dimer negative therefore very low suspicion for PE.  She was given Pepcid and a GI cocktail in the ED and on reevaluation she states her pain has improved and she is feeling gas in her stomach.  Her work-up is reassuring today.  I will start her on Pepcid at home and discuss dietary changes.  We will have her follow-up with her PCP in regards to her symptoms in regards to her elevated alk phos.  Have advised to return to the ED for new or worsening symptoms.  She voiced understanding of plan and reasons to return.  All questions answered.  Patient stable for discharge.   ED Discharge Orders         Ordered    famotidine (PEPCID) 20 MG tablet  2 times daily     01/23/19 [address], [place], PA-C 01/23/19 1521    Sherwood Gambler, MD 01/23/19 [phone]

## 2019-01-23 NOTE — ED Notes (Signed)
Pt dc'd home w/all belongings, a/o x4, ambulatory on dc, driven home by mother

## 2019-01-23 NOTE — ED Triage Notes (Signed)
Pt arrives to ED from home with complaints of centralized chest pain that woke her up at 0400 this morning. Patient states that this has been going on now for months but today the pain did not go away. Describes pain as dull that radiates to herb mid back.

## 2019-05-11 ENCOUNTER — Ambulatory Visit: Payer: BC Managed Care – PPO | Attending: Internal Medicine

## 2019-05-27 ENCOUNTER — Other Ambulatory Visit: Payer: Self-pay

## 2019-05-27 ENCOUNTER — Ambulatory Visit: Payer: BC Managed Care – PPO | Attending: Internal Medicine

## 2019-05-27 DIAGNOSIS — Z20822 Contact with and (suspected) exposure to covid-19: Secondary | ICD-10-CM

## 2019-05-28 LAB — NOVEL CORONAVIRUS, NAA: SARS-CoV-2, NAA: NOT DETECTED

## 2020-03-16 ENCOUNTER — Ambulatory Visit
Admission: EM | Admit: 2020-03-16 | Discharge: 2020-03-16 | Disposition: A | Discharge status: Home / Self Care | Payer: BC Managed Care – PPO

## 2020-03-16 ENCOUNTER — Other Ambulatory Visit: Payer: Self-pay

## 2020-03-16 NOTE — ED Notes (Signed)
Pt presents to have xray of her left cheek. States she fell and was told by her OBGYN to come to Urgent Care for an xray. Per Moshe Cipro patient is in need of a CT scan to rule out fracture in her facial bones. Patient verbalized understanding.

## 2020-03-22 ENCOUNTER — Other Ambulatory Visit: Payer: Self-pay | Admitting: Medical

## 2020-03-24 ENCOUNTER — Other Ambulatory Visit: Payer: BC Managed Care – PPO

## 2020-03-24 ENCOUNTER — Other Ambulatory Visit: Payer: Self-pay | Admitting: Medical

## 2020-03-24 DIAGNOSIS — S0240FA Zygomatic fracture, left side, initial encounter for closed fracture: Secondary | ICD-10-CM

## 2020-04-08 ENCOUNTER — Other Ambulatory Visit: Payer: BC Managed Care – PPO

## 2020-04-13 ENCOUNTER — Ambulatory Visit
Admission: RE | Admit: 2020-04-13 | Discharge: 2020-04-13 | Disposition: A | Discharge status: Home / Self Care | Payer: BC Managed Care – PPO | Source: Ambulatory Visit | Attending: Medical | Admitting: Medical

## 2020-04-13 ENCOUNTER — Other Ambulatory Visit: Payer: Self-pay | Admitting: Family Medicine

## 2020-04-13 DIAGNOSIS — S0240FA Zygomatic fracture, left side, initial encounter for closed fracture: Secondary | ICD-10-CM

## 2020-12-19 IMAGING — DX DG CHEST 2V
2 series · 2 of 2 positions shown · non-contrast
Comparison: None.

CLINICAL DATA: Chest pain

EXAM:
CHEST - 2 VIEW

[chest pa]
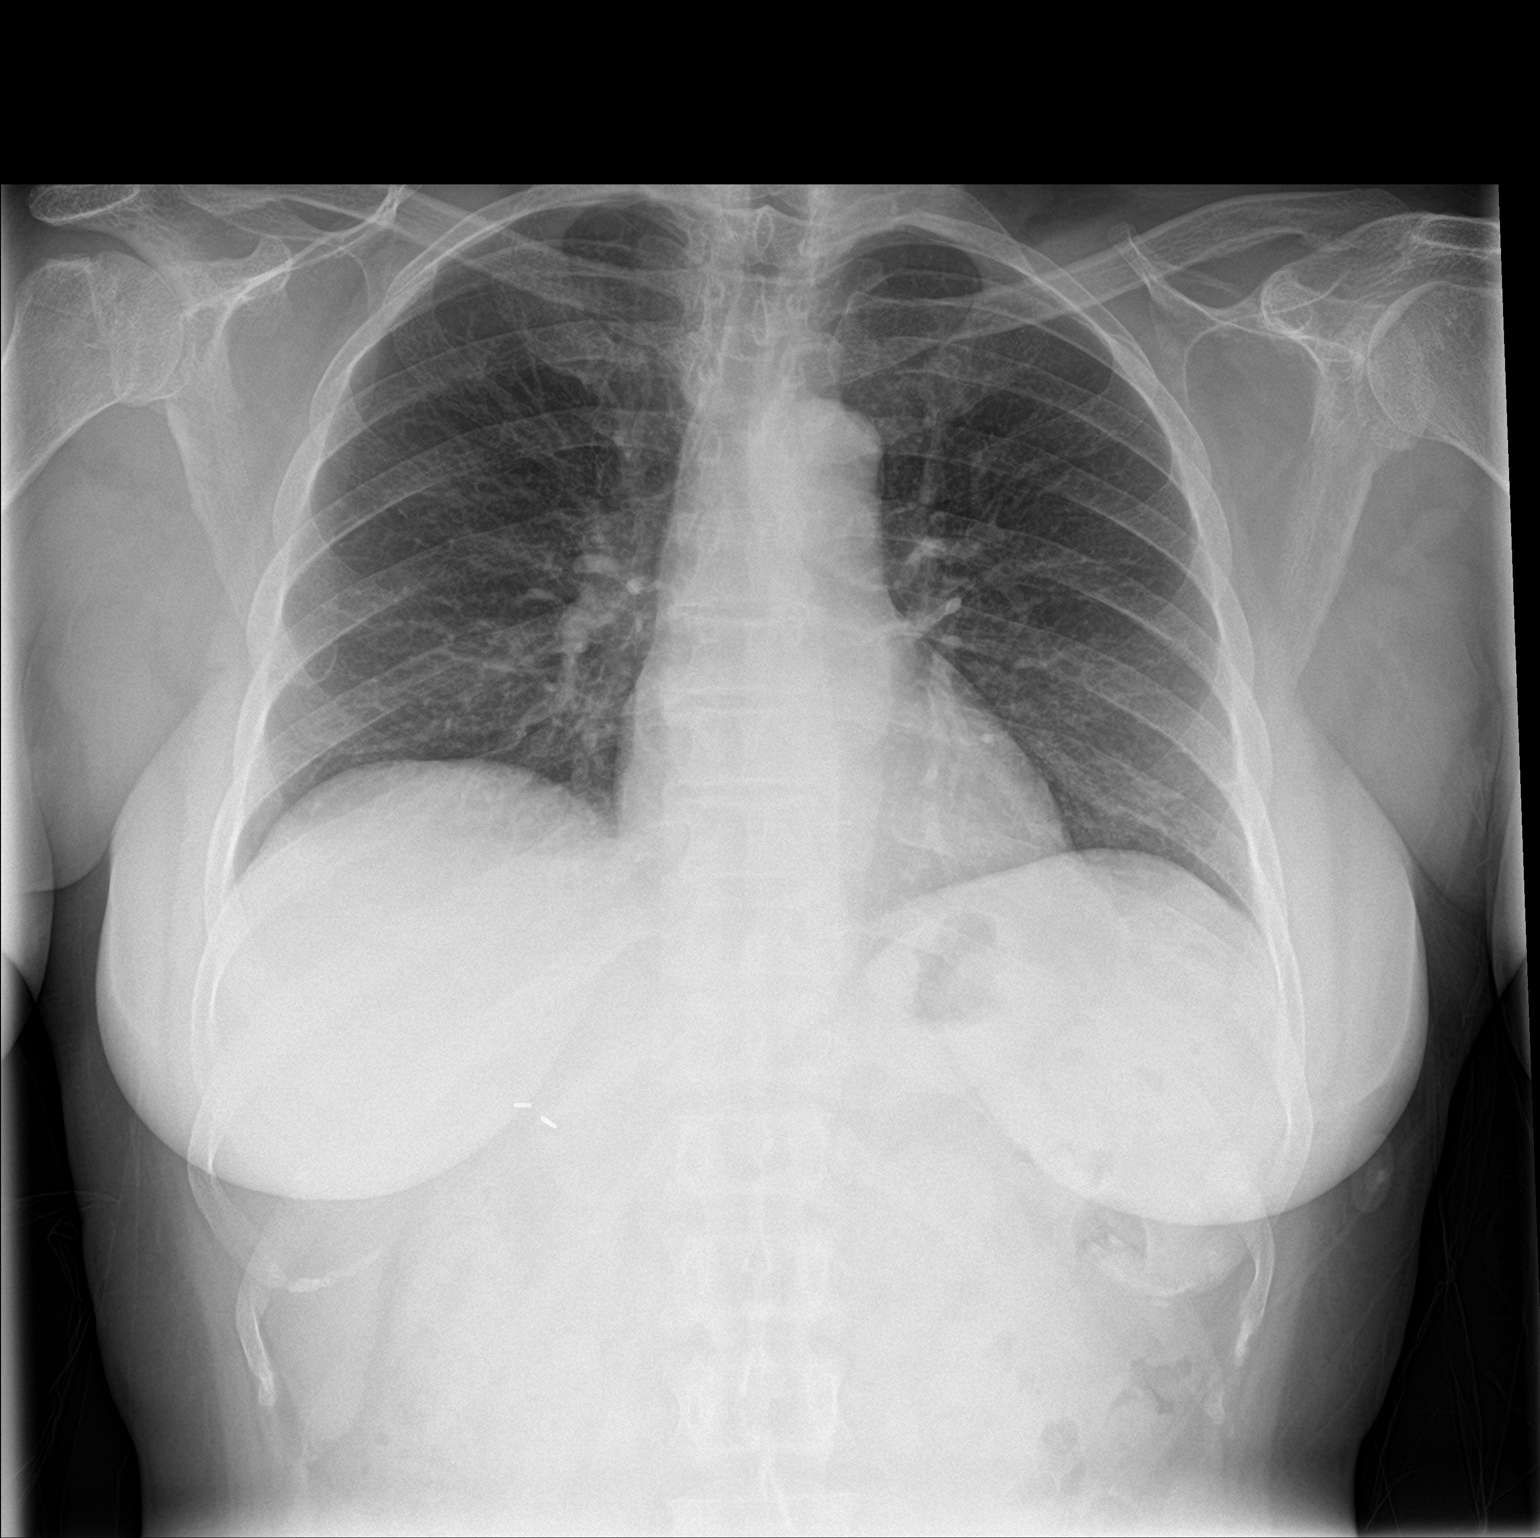

[chest lat]
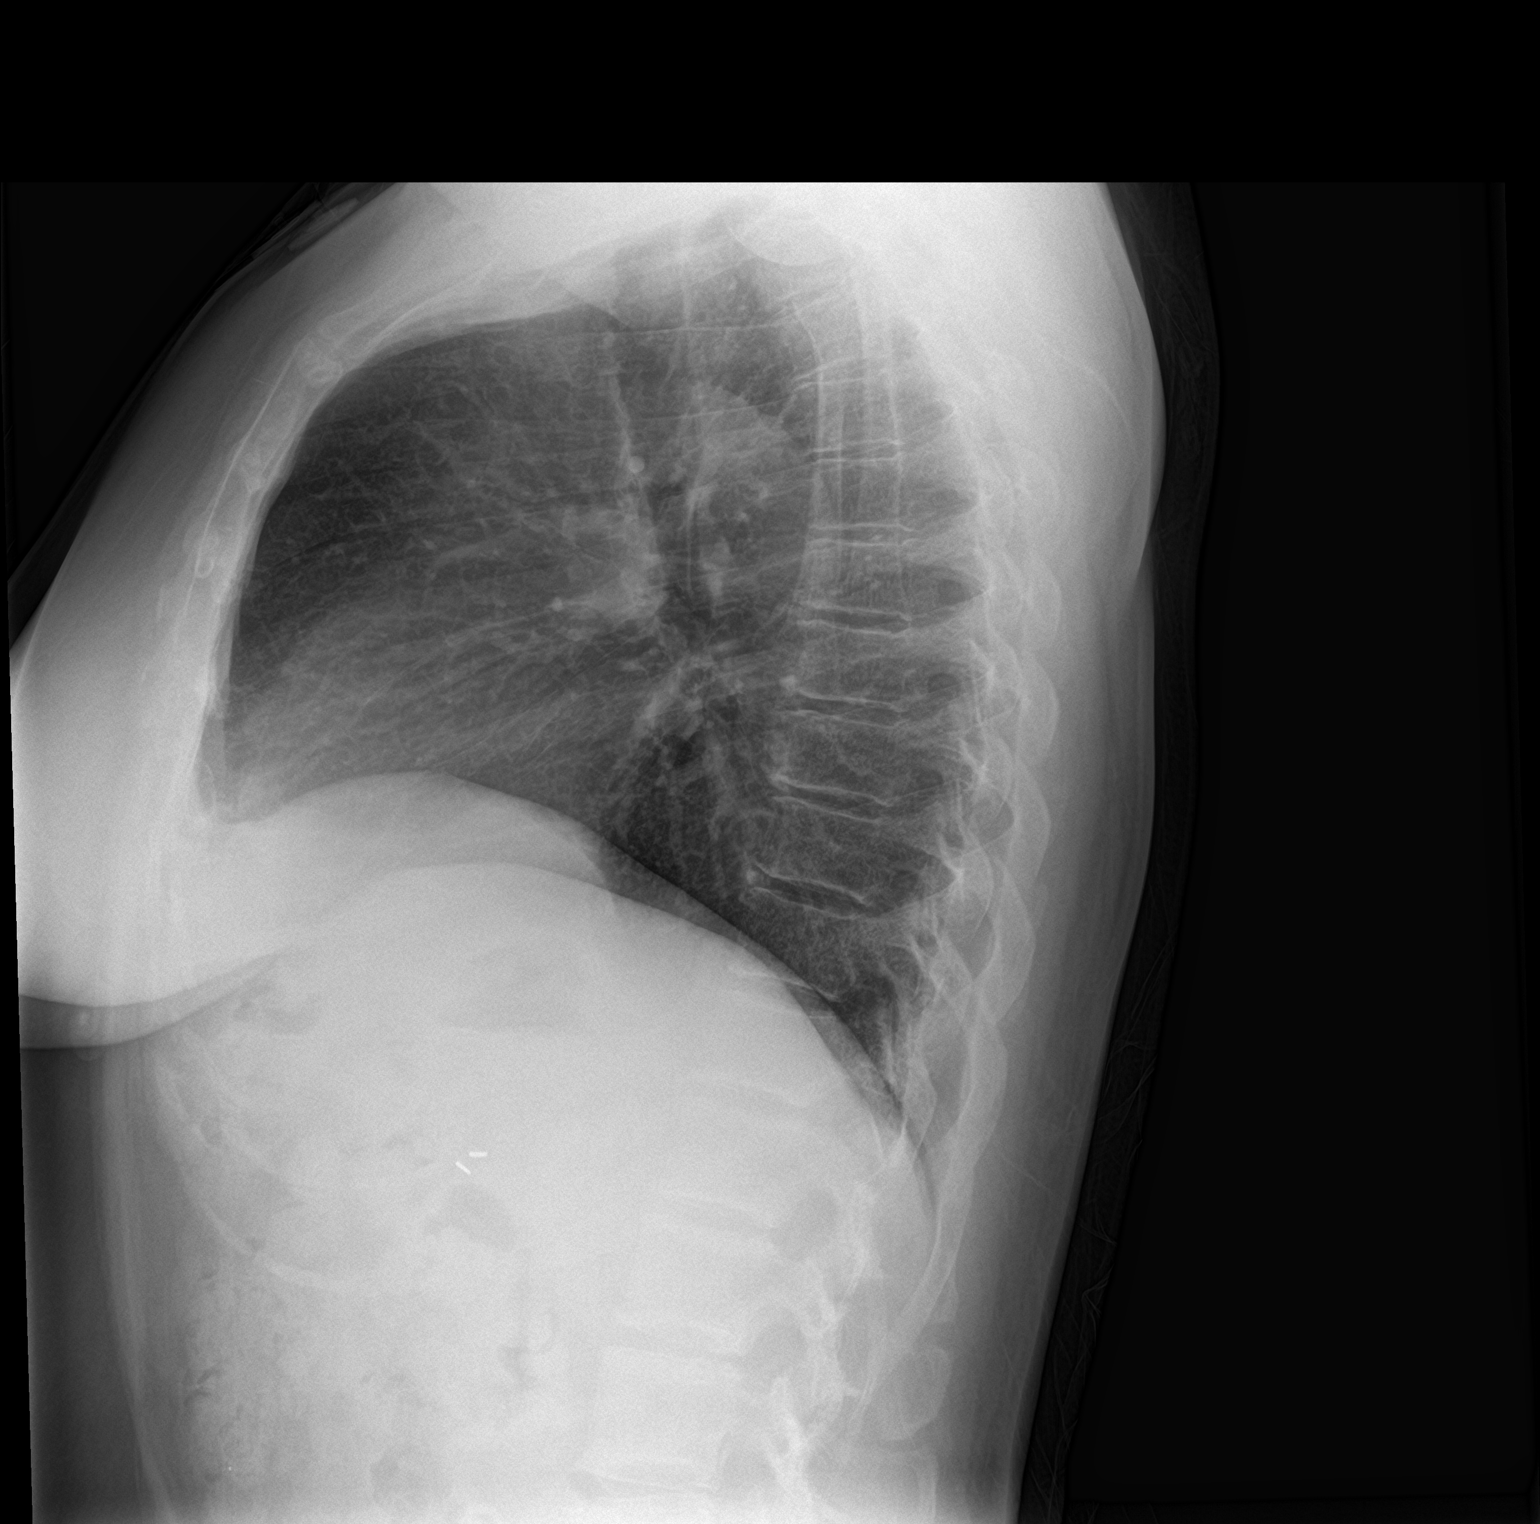

[2 of 2 positions shown; findings below may reference images not displayed]

FINDINGS: Normal heart size. Normal mediastinal contour. No pneumothorax. No
pleural effusion. Lungs appear clear, with no acute consolidative
airspace disease and no pulmonary edema. Cholecystectomy clips are
seen in the right upper quadrant of the abdomen.
IMPRESSION: No active cardiopulmonary disease.

## 2021-02-20 ENCOUNTER — Other Ambulatory Visit: Payer: Self-pay

## 2021-02-20 ENCOUNTER — Emergency Department (HOSPITAL_COMMUNITY): Payer: BC Managed Care – PPO

## 2021-02-20 ENCOUNTER — Emergency Department (HOSPITAL_COMMUNITY)
Admission: EM | Admit: 2021-02-20 | Discharge: 2021-02-20 | Disposition: A | Discharge status: Home / Self Care | Payer: BC Managed Care – PPO | Attending: Emergency Medicine | Admitting: Emergency Medicine

## 2021-02-20 DIAGNOSIS — M542 Cervicalgia: Secondary | ICD-10-CM | POA: Insufficient documentation

## 2021-02-20 DIAGNOSIS — W19XXXA Unspecified fall, initial encounter: Secondary | ICD-10-CM

## 2021-02-20 DIAGNOSIS — S30810A Abrasion of lower back and pelvis, initial encounter: Secondary | ICD-10-CM | POA: Diagnosis not present

## 2021-02-20 DIAGNOSIS — S8992XA Unspecified injury of left lower leg, initial encounter: Secondary | ICD-10-CM | POA: Diagnosis present

## 2021-02-20 DIAGNOSIS — S80812A Abrasion, left lower leg, initial encounter: Secondary | ICD-10-CM | POA: Diagnosis not present

## 2021-02-20 DIAGNOSIS — S0990XA Unspecified injury of head, initial encounter: Secondary | ICD-10-CM | POA: Insufficient documentation

## 2021-02-20 DIAGNOSIS — W11XXXA Fall on and from ladder, initial encounter: Secondary | ICD-10-CM | POA: Insufficient documentation

## 2021-02-20 DIAGNOSIS — I10 Essential (primary) hypertension: Secondary | ICD-10-CM | POA: Insufficient documentation

## 2021-02-20 DIAGNOSIS — Z79899 Other long term (current) drug therapy: Secondary | ICD-10-CM | POA: Insufficient documentation

## 2021-02-20 DIAGNOSIS — Z87891 Personal history of nicotine dependence: Secondary | ICD-10-CM | POA: Insufficient documentation

## 2021-02-20 MED ORDER — CYCLOBENZAPRINE HCL 10 MG PO TABS: 10.0000 mg | ORAL_TABLET | Freq: Two times a day (BID) | ORAL | 0 refills | Status: DC | PRN

## 2021-02-20 NOTE — Discharge Instructions (Signed)
Imaging was all reassuring, recommend over-the-counter pain medication as needed.  I have given you prescription for muscle relaxer please use as needed for pain.  With next coming days I recommend applying warm compresses to your neck and stretching out this help decrease pain inflammation.   Follow-up your PCP if symptoms do not improve after 1 to 2 weeks time.  Come back to the emergency department if you develop chest pain, shortness of breath, severe abdominal pain, uncontrolled nausea, vomiting, diarrhea.

## 2021-02-20 NOTE — ED Triage Notes (Signed)
Pt fell backwards off 10-12 foot ladder hit legs on step railings and hit back of head on chair railing. Denies any LOC. Denies any pain. States neck discomfort.

## 2021-02-20 NOTE — ED Notes (Signed)
Patient transported to CT 

## 2021-02-20 NOTE — ED Notes (Signed)
Pt A&OX4 ambulatory at d/c with independent steady gait, NAD. Pt verbalized understanding of d/c instructions, prescription and follow up care. 

## 2021-02-20 NOTE — ED Provider Notes (Signed)
[facility] EMERGENCY DEPARTMENT Provider Note   CSN: [id number] Arrival date & time: 02/20/21  1403     History Chief Complaint  Patient presents with   Fall    Fall off a 10-12 ft ladder    Rachel Dunn is a 60 y.o. female.  HPI  Patient with no significant medical history presents with complaints of a fall.  Patient states that she was trying to place a candle in a window she was up on a ladder approximate 10 to 12 feet high missed the last rung fell down a couple rungs and fell backwards, she hit her head on a bench.  She denies loss of conscious, is not on anticoagulant, she states that she was able to ambulate after the incident.  She states that her only complaint at this time that she has slight pain in the back of her head as well as her neck, she denies any headaches, change in vision, paresthesias or weakness the upper lower extremities, she denies any back pain, chest pain, difficulty breathing, stomach pain, pain in her upper or lower extremities.  She has no other complaints at this time.  Patient came by POV, has not had anything for pain this time.  Past Medical History:  Diagnosis Date   Anemia    Fibroids    uterine   Hypertension     Patient Active Problem List   Diagnosis Date Noted   S/P laparoscopic assisted vaginal hysterectomy (LAVH) 03/11/2018   S/P TAH (total abdominal hysterectomy) 03/11/2018    Past Surgical History:  Procedure Laterality Date   APPENDECTOMY     CESAREAN SECTION     CHOLECYSTECTOMY     COLONOSCOPY     hx polyp   HYSTERECTOMY ABDOMINAL WITH SALPINGO-OOPHORECTOMY  03/11/2018   Procedure: TOTAL ABDOMINAL HYSTERECTOMY WITH BILATERAL SALPINGO-OOPHORECTOMY;  Surgeon: Louretta Shorten, MD;  Location: [facility];  Service: Gynecology;;   LAPAROSCOPY  03/11/2018   Procedure: DIAGNOSTIC LAPAROSCOPY;  Surgeon: Louretta Shorten, MD;  Location: [facility];  Service: Gynecology;;   LYSIS OF ADHESION  03/11/2018   Procedure: LYSIS OF ADHESIONS;  Surgeon:  Louretta Shorten, MD;  Location: [facility];  Service: Gynecology;;   SHOULDER SURGERY Right      OB History   No obstetric history on file.     Family History  Problem Relation Age of Onset   Breast cancer Maternal Aunt     Social History   Tobacco Use   Smoking status: Former    Packs/day: 0.25    Years: 2.00    Pack years: 0.50    Types: Cigarettes    Quit date: 03/13/1997    Years since quitting: 23.9   Smokeless tobacco: Never  Vaping Use   Vaping Use: Never used  Substance Use Topics   Alcohol use: Yes    Alcohol/week: 3.0 standard drinks    Types: 3 Cans of beer per week   Drug use: Never    Home Medications Prior to Admission medications   Medication Sig Start Date End Date Taking? Authorizing Provider  cyclobenzaprine (FLEXERIL) 10 MG tablet Take 1 tablet (10 mg total) by mouth 2 (two) times daily as needed for muscle spasms. 02/20/21  Yes Marcello Fennel, PA-C  famotidine (PEPCID) 20 MG tablet Take 1 tablet (20 mg total) by mouth 2 (two) times daily. 01/23/19 02/20/21 Yes Couture, Cortni S, PA-C  losartan-hydrochlorothiazide (HYZAAR) 100-12.5 MG tablet Take 1 tablet by mouth at bedtime.   Yes [provider]  sertraline (ZOLOFT) 50 MG tablet Take 25 mg by mouth every morning. 11/22/20  Yes [provider]  ibuprofen (ADVIL,MOTRIN) 600 MG tablet Take 1 tablet (600 mg total) by mouth every 6 (six) hours as needed. Patient not taking: Reported on 02/20/2021 03/12/18   Louretta Shorten, MD  oxyCODONE-acetaminophen (PERCOCET/ROXICET) 5-325 MG tablet Take 1-2 tablets by mouth every 4 (four) hours as needed for moderate pain. Patient not taking: Reported on 02/20/2021 03/12/18   Louretta Shorten, MD    Allergies    Patient has no known allergies.  Review of Systems   Review of Systems  Constitutional:  Negative for chills and fever.  HENT:  Negative for congestion.   Respiratory:  Negative for shortness of breath.   Cardiovascular:  Negative for chest pain.   Gastrointestinal:  Negative for abdominal pain.  Genitourinary:  Negative for enuresis.  Musculoskeletal:  Positive for neck pain. Negative for back pain.       Pain in the back of the head.  Skin:  Negative for rash.  Neurological:  Negative for dizziness.  Hematological:  Does not bruise/bleed easily.   Physical Exam Updated Vital Signs BP 130/78   Pulse 70   Temp 98.5 F (36.9 C) (Oral)   Resp 18   Ht 5\' 6"  (1.676 m)   Wt 86.2 kg   SpO2 100%   BMI 30.67 kg/m   Physical Exam Vitals and nursing note reviewed.  Constitutional:      General: She is not in acute distress.    Appearance: She is not ill-appearing.  HENT:     Head: Normocephalic and atraumatic.     Comments: No deformities of the head present, no raccoon eyes or battle sign present, head was nontender to palpation.    Nose: No congestion.     Mouth/Throat:     Comments: No oral trauma present.  No trismus or torticollis. Eyes:     Extraocular Movements: Extraocular movements intact.     Conjunctiva/sclera: Conjunctivae normal.     Pupils: Pupils are equal, round, and reactive to light.  Neck:     Comments: C-collar was placed on, unable to fully assess C-spine. Cardiovascular:     Rate and Rhythm: Normal rate and regular rhythm.     Pulses: Normal pulses.     Heart sounds: No murmur heard.   No friction rub. No gallop.  Pulmonary:     Effort: No respiratory distress.     Breath sounds: No wheezing, rhonchi or rales.  Chest:     Chest wall: No tenderness.  Abdominal:     Palpations: Abdomen is soft.     Tenderness: There is no abdominal tenderness. There is no right CVA tenderness or left CVA tenderness.  Musculoskeletal:     Comments: Patient is able to move all 4 extremities, neurovascular fully intact, spine was palpated was nontender to palpation, no step-off deformities present.  No pelvis instability, no leg shortening, no internal or external rotation present.  Skin:    General: Skin is warm  and dry.     Comments: Patient 1 small abrasion on her back hemodynamically stable, very superficial.   Small abrasion noted on her left tibia hemodynamically stable superficial nature.  Neurological:     Mental Status: She is alert.     Comments: No facial asymmetry, no difficult word finding, able follow two-step commands, no unilateral weakness present.    Psychiatric:        Mood and Affect: Mood normal.  ED Results / Procedures / Treatments   Labs (all labs ordered are listed, but only abnormal results are displayed) Labs Reviewed - No data to display  EKG None  Radiology DG Tibia/Fibula Left  Result Date: 02/20/2021 CLINICAL DATA:  Pain EXAM: LEFT TIBIA AND FIBULA - 2 VIEW COMPARISON:  None. FINDINGS: There is no evidence of fracture or other focal bone lesions. Soft tissues are unremarkable. IMPRESSION: Negative. Electronically Signed   By: Yetta Glassman M.D.   On: 02/20/2021 15:13   CT Head Wo Contrast  Result Date: 02/20/2021 CLINICAL DATA:  Fall backwards off ladder. EXAM: CT HEAD WITHOUT CONTRAST TECHNIQUE: Contiguous axial images were obtained from the base of the skull through the vertex without intravenous contrast. COMPARISON:  None. FINDINGS: Brain: No acute intracranial abnormality. Specifically, no hemorrhage, hydrocephalus, mass lesion, acute infarction, or significant intracranial injury. Vascular: No hyperdense vessel or unexpected calcification. Skull: No acute calvarial abnormality. Sinuses/Orbits: No acute findings Other: None IMPRESSION: Normal study. Electronically Signed   By: Rolm Baptise M.D.   On: 02/20/2021 15:29   CT Cervical Spine Wo Contrast  Result Date: 02/20/2021 CLINICAL DATA:  Neck trauma, midline tenderness. Patient fell backwards off a 10-12 foot ladder. Hit legs on step railing and hit back of the head on chair railing. EXAM: CT CERVICAL SPINE WITHOUT CONTRAST TECHNIQUE: Multidetector CT imaging of the cervical spine was performed  without intravenous contrast. Multiplanar CT image reconstructions were also generated. COMPARISON:  None. FINDINGS: Alignment: Normal. Skull base and vertebrae: No acute fracture. No primary bone lesion or focal pathologic process. Soft tissues and spinal canal: No prevertebral fluid or swelling. No visible canal hematoma. Disc levels: Mild multilevel DA and disc disease with disc space narrowing and minimal marginal osteophytes. Upper chest: Negative. Other: None. IMPRESSION: No acute fracture or subluxation. No significant soft tissue injury. Electronically Signed   By: Keane Police D.O.   On: 02/20/2021 15:24    Procedures Procedures   Medications Ordered in ED Medications - No data to display  ED Course  I have reviewed the triage vital signs and the nursing notes.  Pertinent labs & imaging results that were available during my care of the patient were reviewed by me and considered in my medical decision making (see chart for details).    MDM Rules/Calculators/A&P                          Initial impression-presents after a fall, she is alert, no acute stress, vital signs reassuring.  Due to mechanism of injury c-collar was placed, we will send her down for scans of her head and neck as well as her left tibia and reassess.  Work-up-CT imaging of head and C-spine both negative for acute findings, x-ray of the tibia was unremarkable.  Reassessment-updated on imaging, she has no complaints this time, c-collar was removed, patient was able to ambulate without difficulty has no complaints this time she is ready for discharge  Rule out- low suspicion for intracranial head bleed as patient denies loss of conscious, is not on anticoagulant, she does not endorse headaches, paresthesia/weakness in the upper and lower extremities, no focal deficits present on my exam.  CT imaging negative for acute findings.  Low suspicion for spinal cord abnormality or spinal fracture spine was palpated was  nontender to palpation, patient has full range of motion in the upper and lower extremities.  Low suspicion for pneumothorax as lung sounds are clear bilaterally, will  defer imaging at this time. Low suspicion for intrathoracic and/or intra-abdominal trauma as chest and abdomen soft nontender to palpation.  Low suspicion for orthopedic injury as imaging is negative for acute findings.   Plan-  Fall-likely patient setting for muscular strain, will provide her with muscle relaxers, follow-up with PCP for further evaluation.  Gave strict return precautions.  Vital signs have remained stable, no indication for hospital admission.  Patient given at home care as well strict return precautions.  Patient verbalized that they understood agreed to said plan.  Final Clinical Impression(s) / ED Diagnoses Final diagnoses:  Fall, initial encounter    Rx / DC Orders ED Discharge Orders          Ordered    cyclobenzaprine (FLEXERIL) 10 MG tablet  2 times daily PRN        02/20/21 1554             Aron Baba 02/20/21 1556    Luna Fuse, MD 02/21/21 2348

## 2021-02-20 NOTE — ED Notes (Signed)
Pt refuses IV start at this time. Pt denies any pain.

## 2021-07-12 ENCOUNTER — Emergency Department (HOSPITAL_BASED_OUTPATIENT_CLINIC_OR_DEPARTMENT_OTHER)
Admission: EM | Admit: 2021-07-12 | Discharge: 2021-07-12 | Disposition: A | Discharge status: Home / Self Care | Payer: BC Managed Care – PPO | Attending: Emergency Medicine | Admitting: Emergency Medicine

## 2021-07-12 ENCOUNTER — Emergency Department (HOSPITAL_BASED_OUTPATIENT_CLINIC_OR_DEPARTMENT_OTHER): Payer: BC Managed Care – PPO

## 2021-07-12 ENCOUNTER — Encounter (HOSPITAL_BASED_OUTPATIENT_CLINIC_OR_DEPARTMENT_OTHER): Payer: Self-pay | Admitting: *Deleted

## 2021-07-12 ENCOUNTER — Other Ambulatory Visit: Payer: Self-pay

## 2021-07-12 DIAGNOSIS — Z23 Encounter for immunization: Secondary | ICD-10-CM | POA: Diagnosis not present

## 2021-07-12 DIAGNOSIS — S62664A Nondisplaced fracture of distal phalanx of right ring finger, initial encounter for closed fracture: Secondary | ICD-10-CM | POA: Insufficient documentation

## 2021-07-12 DIAGNOSIS — W540XXA Bitten by dog, initial encounter: Secondary | ICD-10-CM | POA: Diagnosis not present

## 2021-07-12 DIAGNOSIS — I10 Essential (primary) hypertension: Secondary | ICD-10-CM | POA: Diagnosis not present

## 2021-07-12 DIAGNOSIS — S61451A Open bite of right hand, initial encounter: Secondary | ICD-10-CM | POA: Insufficient documentation

## 2021-07-12 DIAGNOSIS — S62639B Displaced fracture of distal phalanx of unspecified finger, initial encounter for open fracture: Secondary | ICD-10-CM

## 2021-07-12 DIAGNOSIS — S6991XA Unspecified injury of right wrist, hand and finger(s), initial encounter: Secondary | ICD-10-CM | POA: Diagnosis present

## 2021-07-12 MED ORDER — TETANUS-DIPHTH-ACELL PERTUSSIS 5-2.5-18.5 LF-MCG/0.5 IM SUSY
0.5000 mL | PREFILLED_SYRINGE | Freq: Once | INTRAMUSCULAR | Status: AC
  Administered 2021-07-12: 0.5 mL via INTRAMUSCULAR
  Filled 2021-07-12: qty 0.5

## 2021-07-12 NOTE — ED Notes (Signed)
ED Provider at bedside. 

## 2021-07-12 NOTE — Discharge Instructions (Signed)
Please follow up with Dr. Greta Doom for further evaluation of your finger. Continue taking the antibiotics as prescribed.  ? ?Take Ibuprofen/Tylenol as needed for pain.  ? ?Return to the ED for any new/worsening symptoms ?

## 2021-07-12 NOTE — ED Provider Notes (Signed)
?[facility] [facility] EMERGENCY DEPARTMENT ?Provider Note ? ? ?CSN: [id number] ?Arrival date & time: 07/12/21  1557 ? ?  ? ?History ? ?Chief Complaint  ?Patient presents with  ? Animal Bite  ? ? ?Rachel Dunn is a 61 y.o. female with PMHx HTN and anemia who presents to the ED today with complaint of dog bite to R ring finger that occurred 9-10 days ago. Pt reports she had tried to break up a fight between her dog and her neighbors dog when she was bitten on her R hand by the neighbor's dog. She reports the dog is UTD on rabies. She went to an UC at that time and was placed on Augmentin. She finished all of the antibiotics and the wounds on her other fingers have been healing well however she has continued to have pain and swelling to her R ring finger that has worsened. She also is unable to bend at the DIP joints of same finger. She went to her PCP's office today who prescribed her Bactrim and gave her follow up with ortho with plans for likely I&D. She decided to come here for further eval. She does report some mild purulent drainage from the nailbed. She also accidentally hit her finger on the way to the ED today and has had some bleeding. She reports her tetanus expired in April however she was not updated during time of incident. She also mentions never having an xray done.  ? ?The history is provided by the patient and medical records.  ? ?  ? ?Home Medications ?Prior to Admission medications   ?Medication Sig Start Date End Date Taking? Authorizing Provider  ?cyclobenzaprine (FLEXERIL) 10 MG tablet Take 1 tablet (10 mg total) by mouth 2 (two) times daily as needed for muscle spasms. 02/20/21   Marcello Fennel, PA-C  ?famotidine (PEPCID) 20 MG tablet Take 1 tablet (20 mg total) by mouth 2 (two) times daily. 01/23/19 02/20/21  Couture, Cortni S, PA-C  ?ibuprofen (ADVIL,MOTRIN) 600 MG tablet Take 1 tablet (600 mg total) by mouth every 6 (six) hours as needed. ?Patient not taking: Reported on 02/20/2021  03/12/18   Louretta Shorten, MD  ?losartan-hydrochlorothiazide [facility]) 100-12.5 MG tablet Take 1 tablet by mouth at bedtime.    [provider]  ?oxyCODONE-acetaminophen (PERCOCET/ROXICET) 5-325 MG tablet Take 1-2 tablets by mouth every 4 (four) hours as needed for moderate pain. ?Patient not taking: Reported on 02/20/2021 03/12/18   Louretta Shorten, MD  ?sertraline (ZOLOFT) 50 MG tablet Take 25 mg by mouth every morning. 11/22/20   [provider]  ?   ? ?Allergies    ?Patient has no known allergies.   ? ?Review of Systems   ?Review of Systems  ?Constitutional:  Negative for chills and fever.  ?Musculoskeletal:  Positive for arthralgias and joint swelling.  ?Skin:  Positive for color change and wound.  ?All other systems reviewed and are negative. ? ?Physical Exam ?Updated Vital Signs ?BP 140/80 (BP Location: Left Arm)   Pulse 90   Temp (!) 97.5 ?F (36.4 ?C) (Oral)   Resp 18   Ht '5\' 7"'$  (1.702 m)   Wt 87 kg   SpO2 100%   BMI 30.04 kg/m?  ? ?Physical Exam ?Vitals and nursing note reviewed.  ?Constitutional:   ?   Appearance: She is not ill-appearing.  ?HENT:  ?   Head: Normocephalic and atraumatic.  ?Eyes:  ?   Conjunctiva/sclera: Conjunctivae normal.  ?Cardiovascular:  ?   Rate and Rhythm:  Normal rate and regular rhythm.  ?   Pulses: Normal pulses.  ?Pulmonary:  ?   Effort: Pulmonary effort is normal.  ?   Breath sounds: Normal breath sounds.  ?Abdominal:  ?   Palpations: Abdomen is soft.  ?   Tenderness: There is no abdominal tenderness.  ?Musculoskeletal:  ?   Cervical back: Neck supple.  ?   Comments: See photo below. Swelling and TTP to the R 4th finger. Small area of bloody discharge from medial aspect of nail bed where pt hit finger on the way to the ED. No purulent drainage appreciated at this time. Unable to flex at DIP joint. ROM intact to MCP and PIP joint of same finger. Cap refill < 2 seconds. 2+ radial pulse.   ?Skin: ?   General: Skin is warm and dry.  ?Neurological:  ?   Mental Status:  She is alert.  ? ? ? ? ? ? ?ED Results / Procedures / Treatments   ?Labs ?(all labs ordered are listed, but only abnormal results are displayed) ?Labs Reviewed - No data to display ? ?EKG ?None ? ?Radiology ?DG Finger Ring Right ? ?Result Date: 07/12/2021 ?CLINICAL DATA:  Dog bite 2 weeks ago, injury to right fourth digit EXAM: RIGHT RING FINGER 2+V COMPARISON:  None. FINDINGS: There is a nondisplaced transverse fracture through the ring finger distal phalanx without intra-articular extension. There is mild surrounding soft tissue swelling. There is no radiopaque foreign body or soft tissue gas. IMPRESSION: Nondisplaced transverse fracture through the distal phalanx of the ring finger. Mild surrounding soft tissue swelling with no soft tissue gas or radiopaque foreign body. Electronically Signed   By: Valetta Mole M.D.   On: 07/12/2021 16:37   ? ?Procedures ?Procedures  ? ? ?Medications Ordered in ED ?Medications  ?Tdap (BOOSTRIX) injection 0.5 mL (0.5 mLs Intramuscular Given 07/12/21 1705)  ? ? ?ED Course/ Medical Decision Making/ A&P ?Clinical Course as of 07/12/21 1719  ?Tue Jul 12, 2021  ?1704 4:30 PM  [MV]  ?  ?Clinical Course User Index ?[MV] Eustaquio Maize, PA-C  ? ?                        ?Medical Decision Making ?61 year old female who presents to the ED today status post dog bite to right hand that occurred at 9 to 10 days ago.  Has finished course of Augmentin however continues to have swelling and pain to right ring finger.  On arrival to the ED today patient is afebrile, nontachycardic and nontachypneic.  She appears to be in no acute distress.  She is noted to have swelling and tenderness palpation along the right ring finger distal area.  She has a small amount of bloody discharge from the medial aspect of the nailbed however does report that she accidentally hit this area on the way into the ED today.  She is unable to flex at the PIP joint of same finger.  She is neurovascular intact throughout.  She  does mention she never had an x-ray to the injury.  We will plan for same at this time assess for bony abnormality as well as foreign body from dog bite.  The dog is up-to-date on rabies.  Patient states that her tetanus ran out last month that she was never updated during this time, states it has been 10 years.  We will plan update in the ED today.  ? ?Given unable to flex at the PIP joint  there is some concern for flexor tenosynovitis.  She does have good flexion at the PIP and MCP joint.  There is fusiform swelling along the distal aspect of the finger however does not extend to the proximal aspect. She does report purulent drainage over the past few days along nail bed to suggest paronychia as well. No obvious felon appreciated at this time. We will plan to consult hand surgery at this time for further recommendations. ? ?Pt to follow up with hand surgery on Friday - see details below. Will provide splint for comfort. Pt instructed to continue Bactrim as prescribed. She is in agreement with plan and stable for discharge.  ? ?Amount and/or Complexity of Data Reviewed ?Radiology: ordered. ?   Details: Xray with questionable distal phalanx fracture. Per radiology read - nondisplaced transverse fx through distal phalanx of R ring finger with mild surrounding soft tissue swelling with no soft tissue gas or FB. ?Discussion of management or test interpretation with external provider(s): Discussed case with Dr. Velna [facility] - recommends outpatient follow up 4:30 PM on Friday. Likely will need nail removed with I&D for paronychia. No concern for flexor tenosynovitis given swelling and limited ROM only at the DIP joint.  ? ?Risk ?Prescription drug management. ? ? ? ? ? ? ? ? ? ? ?Final Clinical Impression(s) / ED Diagnoses ?Final diagnoses:  ?Dog bite of right hand, initial encounter  ?Open fracture of tuft of distal phalanx of finger  ? ? ?Rx / DC Orders ?ED Discharge Orders   ? ? None  ? ?  ? ? ? ?Discharge  Instructions   ? ?  ?Please follow up with Dr. Greta Doom for further evaluation of your finger. Continue taking the antibiotics as prescribed.  ? ?Take Ibuprofen/Tylenol as needed for pain.  ? ?Return to the ED for any

## 2021-07-12 NOTE — ED Notes (Addendum)
Finger splint placed

## 2021-07-12 NOTE — ED Triage Notes (Signed)
2 weeks ago rec a dog bite, April 23rd, injury to Right 4th digit, has been on amoxicillin, but instructed to return if no improvement, Primary MD ordered Bactrim PO. Swelling noted, Very tender to touch ?

## 2021-10-08 ENCOUNTER — Other Ambulatory Visit: Payer: Self-pay

## 2021-10-08 ENCOUNTER — Encounter (HOSPITAL_COMMUNITY): Payer: Self-pay | Admitting: Student

## 2021-10-08 ENCOUNTER — Emergency Department (HOSPITAL_COMMUNITY)
Admission: EM | Admit: 2021-10-08 | Discharge: 2021-10-08 | Disposition: A | Discharge status: Home / Self Care | Payer: BC Managed Care – PPO | Attending: Emergency Medicine | Admitting: Emergency Medicine

## 2021-10-08 DIAGNOSIS — Z79899 Other long term (current) drug therapy: Secondary | ICD-10-CM | POA: Diagnosis not present

## 2021-10-08 DIAGNOSIS — I10 Essential (primary) hypertension: Secondary | ICD-10-CM | POA: Diagnosis present

## 2021-10-08 LAB — CBC
HCT: 43.8 % (ref 36.0–46.0)
Hemoglobin: 14.3 g/dL (ref 12.0–15.0)
MCH: 29.7 pg (ref 26.0–34.0)
MCHC: 32.6 g/dL (ref 30.0–36.0)
MCV: 90.9 fL (ref 80.0–100.0)
Platelets: 203 10*3/uL (ref 150–400)
RBC: 4.82 MIL/uL (ref 3.87–5.11)
RDW: 12.5 % (ref 11.5–15.5)
WBC: 7.1 10*3/uL (ref 4.0–10.5)
nRBC: 0 % (ref 0.0–0.2)

## 2021-10-08 LAB — BASIC METABOLIC PANEL
Anion gap: 7 (ref 5–15)
BUN: 7 mg/dL (ref 6–20)
CO2: 22 mmol/L (ref 22–32)
Calcium: 9.9 mg/dL (ref 8.9–10.3)
Chloride: 111 mmol/L (ref 98–111)
Creatinine, Ser: 0.81 mg/dL (ref 0.44–1.00)
GFR, Estimated: >60 mL/min (ref >60)
Glucose, Bld: 88 mg/dL (ref 70–99)
Potassium: 3.6 mmol/L (ref 3.5–5.1)
Sodium: 140 mmol/L (ref 135–145)

## 2021-10-08 NOTE — ED Provider Notes (Signed)
[place] EMERGENCY DEPARTMENT Provider Note   CSN: [id number] Arrival date & time: 10/08/21  1746     History  Chief Complaint  Patient presents with   Hypertension    Rachel Dunn is a 61 y.o. female.  The history is provided by the patient.  Hypertension This is a chronic problem. Pertinent negatives include no chest pain and no headaches. Nothing aggravates the symptoms.   Patient reports she checked her blood pressure at home and it was 177/100.  It was checked again and it was 150s over 100.  She takes losartan daily without any recent changes.  She checks her blood pressure daily and she did around the same time.  No excessive caffeine use.  No drug use.  She had no symptoms at that time.  No headache or visual changes.  No slurred speech.  No chest pain or shortness of breath.  No focal weakness No history of stroke or CAD    Home Medications Prior to Admission medications   Medication Sig Start Date End Date Taking? Authorizing Provider  famotidine (PEPCID) 20 MG tablet Take 1 tablet (20 mg total) by mouth 2 (two) times daily. 01/23/19 02/20/21  Couture, Cortni S, PA-C  losartan-hydrochlorothiazide (HYZAAR) 100-12.5 MG tablet Take 1 tablet by mouth at bedtime.    [provider]  sertraline (ZOLOFT) 50 MG tablet Take 25 mg by mouth every morning. 11/22/20   [provider]      Allergies    Patient has no known allergies.    Review of Systems   Review of Systems  Eyes:  Negative for visual disturbance.  Cardiovascular:  Negative for chest pain.  Neurological:  Negative for speech difficulty, weakness and headaches.    Physical Exam Updated Vital Signs BP 125/73 (BP Location: Right Arm)   Pulse 77   Temp 98.8 F (37.1 C) (Oral)   Resp 18   LMP 01/28/2018 (Exact Date)   SpO2 99%  Physical Exam CONSTITUTIONAL: Well developed/well nourished HEAD: Normocephalic/atraumatic EYES: EOMI/PERRL, no nystagmus,  no  ptosis ENMT: Mucous membranes moist NECK: supple no meningeal signs CV: S1/S2 noted, no murmurs/rubs/gallops noted LUNGS: Lungs are clear to auscultation bilaterally, no apparent distress ABDOMEN: soft NEURO:Awake/alert, face symmetric, no arm or leg drift is noted Equal 5/5 strength with shoulder abduction, elbow flex/extension, wrist flex/extension in upper extremities and equal hand grips bilaterally Equal 5/5 strength with hip flexion,knee flex/extension, foot dorsi/plantar flexion Cranial nerves 3/4/5/6/09/18/08/11/12 tested and intact Gait normal without ataxia EXTREMITIES: pulses normal, full ROM SKIN: warm, color normal PSYCH: no abnormalities of mood noted  ED Results / Procedures / Treatments   Labs (all labs ordered are listed, but only abnormal results are displayed) Labs Reviewed  CBC  BASIC METABOLIC PANEL    EKG None  Radiology No results found.  Procedures Procedures    Medications Ordered in ED Medications - No data to display  ED Course/ Medical Decision Making/ A&P                           Medical Decision Making Amount and/or Complexity of Data Reviewed Labs: ordered.   Patient presented with asymptomatic elevated blood pressure.  It is now spontaneously improving.  Patient has no associated symptoms.  Plan to continue her home medications, check BP every day and follow-up with her PCP in 1 week to see if she needs to have any medication adjustments No indication for active management  at this time.  She is safe for discharge home         Final Clinical Impression(s) / ED Diagnoses Final diagnoses:  Primary hypertension    Rx / DC Orders ED Discharge Orders     None         Ripley Fraise, MD 10/08/21 2330

## 2021-10-08 NOTE — ED Triage Notes (Signed)
Pt with hx of hypertension, medicated with Losartan, here for eval of hypertension (177/100, 155/100) today on her home cuff. Compliant with medications and denies any complaints.

## 2022-12-20 ENCOUNTER — Other Ambulatory Visit: Payer: Self-pay | Admitting: Orthopedic Surgery

## 2022-12-25 LAB — SURGICAL PATHOLOGY

## 2023-02-05 ENCOUNTER — Other Ambulatory Visit: Payer: Self-pay

## 2023-02-05 ENCOUNTER — Emergency Department (HOSPITAL_COMMUNITY)
Admission: EM | Admit: 2023-02-05 | Discharge: 2023-02-05 | Disposition: A | Discharge status: Home / Self Care | Payer: BC Managed Care – PPO | Attending: Emergency Medicine | Admitting: Emergency Medicine

## 2023-02-05 ENCOUNTER — Encounter (HOSPITAL_COMMUNITY): Payer: Self-pay | Admitting: Radiology

## 2023-02-05 DIAGNOSIS — I10 Essential (primary) hypertension: Secondary | ICD-10-CM | POA: Diagnosis present

## 2023-02-05 LAB — BASIC METABOLIC PANEL
Anion gap: 6 (ref 5–15)
BUN: 13 mg/dL (ref 6–20)
CO2: 23 mmol/L (ref 22–32)
Calcium: 9.5 mg/dL (ref 8.9–10.3)
Chloride: 111 mmol/L (ref 98–111)
Creatinine, Ser: 0.7 mg/dL (ref 0.44–1.00)
GFR, Estimated: >60 mL/min (ref >60)
Glucose, Bld: 101 mg/dL — ABNORMAL HIGH (ref 70–99)
Potassium: 4 mmol/L (ref 3.5–5.1)
Sodium: 140 mmol/L (ref 135–145)

## 2023-02-05 LAB — CBC WITH DIFFERENTIAL/PLATELET
Abs Immature Granulocytes: 0.02 10*3/uL (ref 0.00–0.07)
Basophils Absolute: 0 10*3/uL (ref 0.0–0.1)
Basophils Relative: 1 %
Eosinophils Absolute: 0.2 10*3/uL (ref 0.0–0.5)
Eosinophils Relative: 4 %
HCT: 41.7 % (ref 36.0–46.0)
Hemoglobin: 13.9 g/dL (ref 12.0–15.0)
Immature Granulocytes: 0 %
Lymphocytes Relative: 29 %
Lymphs Abs: 1.9 10*3/uL (ref 0.7–4.0)
MCH: 29.9 pg (ref 26.0–34.0)
MCHC: 33.3 g/dL (ref 30.0–36.0)
MCV: 89.7 fL (ref 80.0–100.0)
Monocytes Absolute: 0.5 10*3/uL (ref 0.1–1.0)
Monocytes Relative: 8 %
Neutro Abs: 3.8 10*3/uL (ref 1.7–7.7)
Neutrophils Relative %: 58 %
Platelets: 188 10*3/uL (ref 150–400)
RBC: 4.65 MIL/uL (ref 3.87–5.11)
RDW: 13 % (ref 11.5–15.5)
WBC: 6.5 10*3/uL (ref 4.0–10.5)
nRBC: 0 % (ref 0.0–0.2)

## 2023-02-05 NOTE — ED Provider Notes (Signed)
[place] EMERGENCY DEPARTMENT AT [facility] Provider Note  CSN: [id number] Arrival date & time: 02/05/23 2016  Chief Complaint(s) Hypertension  HPI Rachel Dunn is a 62 y.o. female history of anemia, hypertension presenting to the emergency department with high blood pressure.  She reports that she had a mild headache earlier today and decided to check her blood pressure, and it was elevated to 180.  She was worried about this and decided to come to the emergency department.  Her headache is resolved.  She reports that she had a slight bit of lightheadedness when walking her dog as well.  No nausea or vomiting, numbness or tingling, weakness, syncope, back pain, abdominal pain, vision changes, loss of consciousness, or any other new symptoms.   Past Medical History Past Medical History:  Diagnosis Date   Anemia    Fibroids    uterine   Hypertension    Patient Active Problem List   Diagnosis Date Noted   S/P laparoscopic assisted vaginal hysterectomy (LAVH) 03/11/2018   S/P TAH (total abdominal hysterectomy) 03/11/2018   Home Medication(s) Prior to Admission medications   Medication Sig Start Date End Date Taking? Authorizing Provider  famotidine (PEPCID) 20 MG tablet Take 1 tablet (20 mg total) by mouth 2 (two) times daily. 01/23/19 02/20/21  Couture, Cortni S, PA-C  losartan-hydrochlorothiazide (HYZAAR) 100-12.5 MG tablet Take 1 tablet by mouth at bedtime.    [provider]  sertraline (ZOLOFT) 50 MG tablet Take 25 mg by mouth every morning. 11/22/20   [provider]                                                                                                                                    Past Surgical History Past Surgical History:  Procedure Laterality Date   APPENDECTOMY     CESAREAN SECTION     CHOLECYSTECTOMY     COLONOSCOPY     hx polyp   HYSTERECTOMY ABDOMINAL WITH SALPINGO-OOPHORECTOMY  03/11/2018   Procedure: TOTAL  ABDOMINAL HYSTERECTOMY WITH BILATERAL SALPINGO-OOPHORECTOMY;  Surgeon: Candice Camp, MD;  Location: [facility];  Service: Gynecology;;   LAPAROSCOPY  03/11/2018   Procedure: DIAGNOSTIC LAPAROSCOPY;  Surgeon: Candice Camp, MD;  Location: [facility];  Service: Gynecology;;   LYSIS OF ADHESION  03/11/2018   Procedure: LYSIS OF ADHESIONS;  Surgeon: Candice Camp, MD;  Location: [facility];  Service: Gynecology;;   SHOULDER SURGERY Right    Family History Family History  Problem Relation Age of Onset   Breast cancer Maternal Aunt     Social History Social History   Tobacco Use   Smoking status: Former    Current packs/day: 0.00    Average packs/day: 0.3 packs/day for 2.0 years (0.5 ttl pk-yrs)    Types: Cigarettes    Start date: 03/14/1995    Quit date: 03/13/1997    Years since quitting: 25.9   Smokeless tobacco: Never  Vaping  Use   Vaping status: Never Used  Substance Use Topics   Alcohol use: Not Currently    Alcohol/week: 3.0 standard drinks of alcohol    Types: 3 Cans of beer per week   Drug use: Never   Allergies Patient has no known allergies.  Review of Systems Review of Systems  All other systems reviewed and are negative.   Physical Exam Vital Signs  I have reviewed the triage vital signs BP (!) 153/73   Pulse (!) 56   Temp 98 F (36.7 C) (Oral)   Resp 18   Ht 5\' 7"  (1.702 m)   Wt 88.5 kg   LMP 01/28/2018 (Exact Date)   SpO2 99%   BMI 30.54 kg/m  Physical Exam Vitals and nursing note reviewed.  Constitutional:      General: She is not in acute distress.    Appearance: She is well-developed.  HENT:     Head: Normocephalic and atraumatic.     Mouth/Throat:     Mouth: Mucous membranes are moist.  Eyes:     Pupils: Pupils are equal, round, and reactive to light.  Cardiovascular:     Rate and Rhythm: Normal rate and regular rhythm.     Heart sounds: No murmur heard. Pulmonary:     Effort: Pulmonary effort is normal. No respiratory distress.     Breath sounds: Normal  breath sounds.  Abdominal:     General: Abdomen is flat.     Palpations: Abdomen is soft.     Tenderness: There is no abdominal tenderness.  Musculoskeletal:        General: No tenderness.     Right lower leg: No edema.     Left lower leg: No edema.  Skin:    General: Skin is warm and dry.  Neurological:     General: No focal deficit present.     Mental Status: She is alert. Mental status is at baseline.     Comments: Cranial nerves II through XII intact, strength 5 out of 5 in the bilateral upper and lower extremities, no sensory deficit to light touch, no dysmetria on finger-nose-finger testing  Psychiatric:        Mood and Affect: Mood normal.        Behavior: Behavior normal.     ED Results and Treatments Labs (all labs ordered are listed, but only abnormal results are displayed) Labs Reviewed  BASIC METABOLIC PANEL - Abnormal; Notable for the following components:      Result Value   Glucose, Bld 101 (*)    All other components within normal limits  CBC WITH DIFFERENTIAL/PLATELET                                                                                                                          Radiology No results found.  Pertinent labs & imaging results that were available during my care of the patient were reviewed by me and considered in my medical decision  making (see MDM for details).  Medications Ordered in ED Medications - No data to display                                                                                                                                   Procedures Procedures  (including critical care time)  Medical Decision Making / ED Course   MDM:  62 year old female presenting to the emergency department with hypertension.  She initially had a very mild headache which is completely resolved.  Her neurologic exam is normal.  Her laboratory testing is reassuring without evidence of AKI or other acute process.  She does have  hypertension in the emergency department although it is not severely elevated.  She denies any other specific complaints such as chest pain, abdominal pain to suggest other acute process such as ACS or other process.  She did have a mild headache, extremely low concern for any acute intracranial process such as tumor or mass effect, bleeding, subarachnoid hemorrhage, stroke.  Advised to keep monitor of blood pressure at home, follow-up with primary physician. Will discharge patient to home. All questions answered. Patient comfortable with plan of discharge. Return precautions discussed with patient and specified on the after visit summary.        Additional history obtained: -Additional history obtained from friend   Lab Tests: -I ordered, reviewed, and interpreted labs.   The pertinent results include:   Labs Reviewed  BASIC METABOLIC PANEL - Abnormal; Notable for the following components:      Result Value   Glucose, Bld 101 (*)    All other components within normal limits  CBC WITH DIFFERENTIAL/PLATELET    Notable for normal results other than borderline glucose    Medicines ordered and prescription drug management: No orders of the defined types were placed in this encounter.   -I have reviewed the patients home medicines and have made adjustments as needed Social Determinants of Health:  Diagnosis or treatment significantly limited by social determinants of health: obesity   Reevaluation: After the interventions noted above, I reevaluated the patient and found that their symptoms have improved  Co morbidities that complicate the patient evaluation  Past Medical History:  Diagnosis Date   Anemia    Fibroids    uterine   Hypertension       Dispostion: Disposition decision including need for hospitalization was considered, and patient discharged from emergency department.    Final Clinical Impression(s) / ED Diagnoses Final diagnoses:  Hypertension, unspecified  type     This chart was dictated using voice recognition software.  Despite best efforts to proofread,  errors can occur which can change the documentation meaning.    Lonell Grandchild, MD 02/05/23 2233

## 2023-02-05 NOTE — ED Triage Notes (Signed)
Pt states she noticed her blood pressure being high around 6pm tonight. Pt states she is on BP meds and has taken them as ordered. Pt states she was a bit dizzy while walking her dog.

## 2023-02-05 NOTE — ED Notes (Signed)
HTN with HA started at 6pm Takes losartan at home Took last dose approx 1 hour ago  BP currently 151/80 HA is resolved

## 2023-02-05 NOTE — Discharge Instructions (Addendum)
We evaluated you for your high blood pressure.  Your testing and your physical examination were normal.  High blood pressure typically causes problems over a prolonged period of time, so it is important to get the blood pressure under control, but with reassuring testing in the emergency department, it is safe to go home and follow-up with your primary doctor to help improve your blood pressure.   Please keep a log of your blood pressures he can discuss this with your primary doctor when you see them next.  Please return to the emergency department if you have new or worsening symptoms such as severe headaches, vomiting, severe chest pain or difficulty breathing, leg swelling, abdominal pain, back pain, or any other new concerning symptoms.

## 2023-02-06 ENCOUNTER — Emergency Department (HOSPITAL_COMMUNITY)
Admission: EM | Admit: 2023-02-06 | Discharge: 2023-02-06 | Disposition: A | Discharge status: Home / Self Care | Payer: BC Managed Care – PPO | Attending: Emergency Medicine | Admitting: Emergency Medicine

## 2023-02-06 ENCOUNTER — Emergency Department (HOSPITAL_COMMUNITY): Payer: BC Managed Care – PPO

## 2023-02-06 ENCOUNTER — Encounter (HOSPITAL_COMMUNITY): Payer: Self-pay | Admitting: Emergency Medicine

## 2023-02-06 DIAGNOSIS — I10 Essential (primary) hypertension: Secondary | ICD-10-CM | POA: Diagnosis present

## 2023-02-06 DIAGNOSIS — Z79899 Other long term (current) drug therapy: Secondary | ICD-10-CM | POA: Diagnosis not present

## 2023-02-06 NOTE — ED Notes (Signed)
Report received from Belenda Cruise RN. Assumed care of pt at this time.

## 2023-02-06 NOTE — ED Triage Notes (Signed)
Pt back to ED for continued htn with some minor  face tingling ling different from yesterday , was seen at [facility] yesterday for same

## 2023-02-06 NOTE — ED Provider Notes (Signed)
[place] EMERGENCY DEPARTMENT AT [facility] Provider Note   CSN: [id number] Arrival date & time: 02/06/23  1446     History  Chief Complaint  Patient presents with   Hypertension    Rachel Dunn is a 62 y.o. female with history of hypertension on losartan-hydrochlorothiazide x 20 years presents with complaints of high blood pressure and intermittent headache.  She was evaluated at this emergency room yesterday for similar complaints CBC, and BMP were unremarkable. In the triage she mentioned facial tingling however, this was not mentioned during my exam. She says she just had a little bit of a headache and "felt off" but otherwise is without complaints. Denies any CP, SOB, Abd pain, N/V, blurry vision, weakness, difficulty speaking or ambulating.    Hypertension Associated symptoms include headaches.       Home Medications Prior to Admission medications   Medication Sig Start Date End Date Taking? Authorizing Provider  famotidine (PEPCID) 20 MG tablet Take 1 tablet (20 mg total) by mouth 2 (two) times daily. 01/23/19 02/20/21  Couture, Cortni S, PA-C  losartan-hydrochlorothiazide (HYZAAR) 100-12.5 MG tablet Take 1 tablet by mouth at bedtime.    [provider]  sertraline (ZOLOFT) 50 MG tablet Take 25 mg by mouth every morning. 11/22/20   [provider]      Allergies    Patient has no known allergies.    Review of Systems   Review of Systems  Neurological:  Positive for headaches.    Physical Exam Updated Vital Signs BP (!) 144/96 (BP Location: Left Arm)   Pulse 77   Temp 98 F (36.7 C) (Oral)   Resp 16   LMP 01/28/2018 (Exact Date)   SpO2 100%  Physical Exam Vitals and nursing note reviewed.  Constitutional:      General: She is not in acute distress.    Appearance: She is well-developed.  HENT:     Head: Normocephalic and atraumatic.  Eyes:     Conjunctiva/sclera: Conjunctivae normal.  Cardiovascular:     Rate and  Rhythm: Normal rate and regular rhythm.     Heart sounds: No murmur heard. Pulmonary:     Effort: Pulmonary effort is normal. No respiratory distress.     Breath sounds: Normal breath sounds.  Abdominal:     Palpations: Abdomen is soft.     Tenderness: There is no abdominal tenderness.  Musculoskeletal:        General: No swelling.     Cervical back: Neck supple.  Skin:    General: Skin is warm and dry.     Capillary Refill: Capillary refill takes less than 2 seconds.  Neurological:     General: No focal deficit present.     Mental Status: She is alert and oriented to person, place, and time.     Cranial Nerves: No cranial nerve deficit.     Sensory: No sensory deficit.     Motor: No weakness.     Coordination: Coordination normal.     Gait: Gait normal.  Psychiatric:        Mood and Affect: Mood normal.     ED Results / Procedures / Treatments   Labs (all labs ordered are listed, but only abnormal results are displayed) Labs Reviewed - No data to display  EKG EKG Interpretation Date/Time:  Tuesday February 06 2023 15:22:42 EST Ventricular Rate:  83 PR Interval:  178 QRS Duration:  74 QT Interval:  384 QTC Calculation: 451 R Axis:  25  Text Interpretation: Normal sinus rhythm Nonspecific T wave abnormality Confirmed by Cathren Laine ([id number]) on 02/06/2023 6:36:24 PM  Radiology CT HEAD WO CONTRAST ( )  Result Date: 02/06/2023 CLINICAL DATA:  Headache, increasing frequency or severity. EXAM: CT HEAD WITHOUT CONTRAST TECHNIQUE: Contiguous axial images were obtained from the base of the skull through the vertex without intravenous contrast. RADIATION DOSE REDUCTION: This exam was performed according to the departmental dose-optimization program which includes automated exposure control, adjustment of the mA and/or kV according to patient size and/or use of iterative reconstruction technique. COMPARISON:  Head CT 02/20/2021 FINDINGS: Brain: There is no evidence of an  acute infarct, intracranial hemorrhage, mass, midline shift, or extra-axial fluid collection. The ventricles and sulci are normal. Vascular: No hyperdense vessel. Skull: No acute fracture or suspicious osseous lesion. Sinuses/Orbits: No significant inflammatory changes in the paranasal sinuses. Clear mastoid air cells. Unremarkable orbits. Other: None. IMPRESSION: Negative head CT. Electronically Signed   By: Sebastian Ache M.D.   On: 02/06/2023 19:24    Procedures Procedures    Medications Ordered in ED Medications - No data to display  ED Course/ Medical Decision Making/ A&P                                 Medical Decision Making Amount and/or Complexity of Data Reviewed Radiology: ordered.   This patient presents to the ED with chief complaint(s) of elevated blood pressure and HA with pertinent past medical history of HTN.  The complaint involves an extensive differential diagnosis and also carries with it a high risk of complications and morbidity.    The differential diagnosis includes  Hypertension, stroke, mass, tension HA, migraine, ACS The initial plan is to  CT head ordered in triage, will observe Additional history obtained: No additional historians  Records reviewed previous admission documents  Initial Assessment:   Patient is very well-appearing with a unremarkable neurological exam.  Other than mild intermittent headache, patient's presentation is consistent with asymptomatic hypertension.  She has been compliant with her home medications.  She has a appointment with her primary care tomorrow morning.  Should her CT scan improve negative her blood pressure trend downward will plan for discharge  Independent ECG interpretation:  normal sinus rhythm, nonspecific T wave abnormality  Independent labs interpretation:  The following labs were independently interpreted:  Reviewed CBC and BMP from yesterday's visit.  These were unremarkable.  Independent visualization and  interpretation of imaging: I independently visualized the following imaging with scope of interpretation limited to determining acute life threatening conditions related to emergency care: CT head, which revealed no acute abnormality  Treatment and Reassessment: No medications given during patient's visit.  Vitals were rechecked and her blood pressure was 144/96.  She was entirely asymptomatic.  With a normal neurological exam.  Discussed negative CT findings with her.  Given her close follow-up with PCP feel comfortable with discharge.  Patient is in agreement with plan.  Consultations obtained:   None  Disposition:   Patient discharged home with close PCP follow-up. The patient has been appropriately medically screened and/or stabilized in the ED. I have low suspicion for any other emergent medical condition which would require further screening, evaluation or treatment in the ED or require inpatient management. At time of discharge the patient is hemodynamically stable and in no acute distress. I have discussed work-up results and diagnosis with patient and answered all questions. Patient is agreeable  with discharge plan. We discussed strict return precautions for returning to the emergency department and they verbalized understanding.     Social Determinants of Health:   None This note was dictated with voice recognition software.  Despite best efforts at proofreading, errors may have occurred which can change the documentation meaning.           Final Clinical Impression(s) / ED Diagnoses Final diagnoses:  Hypertension, unspecified type    Rx / DC Orders ED Discharge Orders     None         Fabienne Bruns 02/06/23 2046    Cathren Laine, MD 02/13/23 [phone]

## 2023-02-06 NOTE — Discharge Instructions (Addendum)
It was a pleasure taking care of you this evening.  You were evaluated in the emergency room for elevated blood pressure and headache.  A CT scan was performed of your head which was normal.  Upon arrival your blood pressure was elevated to the 190s at time of discharge it had normalized down to the 140s.  It is imperative for you to follow-up with your primary care doctor to continue management of your blood pressure long-term.  If you experience any new or worsening symptoms including chest pain, shortness of breath, dizziness or difficulty balancing please return to the emergency room.

## 2024-01-28 ENCOUNTER — Ambulatory Visit (INDEPENDENT_AMBULATORY_CARE_PROVIDER_SITE_OTHER)

## 2024-01-28 VITALS — BP 118/76 | HR 80 | Temp 97.8°F | Ht 66.0 in | Wt 198.0 lb

## 2024-01-28 DIAGNOSIS — G4721 Circadian rhythm sleep disorder, delayed sleep phase type: Secondary | ICD-10-CM

## 2024-01-28 DIAGNOSIS — R0683 Snoring: Secondary | ICD-10-CM | POA: Diagnosis not present

## 2024-01-28 DIAGNOSIS — R5383 Other fatigue: Secondary | ICD-10-CM

## 2024-01-28 NOTE — Patient Instructions (Signed)
  VISIT SUMMARY: Today, you were seen for concerns about snoring and potential sleep apnea. You have been experiencing pronounced snoring, episodes of apnea during sleep, waking up gasping or choking for air, and dry mouth in the morning. Despite feeling tired during the day, you do not feel excessively sleepy. You also shared details about your sleep pattern and lifestyle habits.  YOUR PLAN: -SUSPECTED OBSTRUCTIVE SLEEP APNEA WITH SNORING AND FATIGUE: Obstructive sleep apnea is a condition where the airway becomes blocked during sleep, causing breathing to stop and start repeatedly. To confirm this diagnosis, a home sleep study has been ordered. In the meantime, you are advised to try sleeping on your side to help alleviate symptoms. If the sleep study confirms sleep apnea, we will start you on CPAP therapy, which helps keep your airway open during sleep.  INSTRUCTIONS: Please complete the home sleep study as soon as possible. Follow up with us  once the results are available so we can discuss the next steps, including the potential initiation of CPAP therapy if sleep apnea is confirmed.                      Contains text generated by Abridge.                                 Contains text generated by Abridge.

## 2024-01-28 NOTE — Progress Notes (Signed)
 Pulmonology Office Visit   Subjective:  Patient ID: Rachel Dunn, female    DOB: November 17, 1960  MRN: [id number]  Referred by: Freddy Blinks, MD  CC:  Chief Complaint  Patient presents with   Consult    Snoring, gasping for air during sleep, daytime fatigue    HPI Rachel Dunn is a 63 y.o. female with hypertension presents for evaluation of snoring/OSA.  Discussed the use of AI scribe software for clinical note transcription with the patient, who gave verbal consent to proceed.  History of Present Illness   Rachel Dunn is a 63 year old female who presents with snoring and potential sleep apnea.  She experiences pronounced snoring, especially when sleeping on her back, and has been informed by her friends and daughter about potential sleep apnea. She reports episodes of apnea during sleep, waking up gasping or choking for air, and experiences dry mouth in the morning. Despite feeling tired during the day, she does not feel excessively sleepy. No morning headaches are present.  Her sleep pattern involves going to bed around 10 PM, but she takes about an hour to fall asleep, often due to stress and pre-sleep activities like watching TV and checking emails. She wakes up approximately four times a night to urinate and takes at least 30 minutes to fall back asleep. She typically wakes up at 6:45 AM with an alarm but would naturally wake up around 8 AM if allowed. On weekends, she sleeps in later.  She denies restless leg syndrome, sleepwalking, sleep talking, nightmares, and night terrors. Her social history includes a past smoking history of half a pack per day for two years, quitting over ten years ago. She consumes caffeinated drinks, specifically Coke, with the last drink around 6 PM. 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 There is no family history of sleep apnea.      PRIOR TESTS and IMAGING: pCO2  01/2023>23.     01/28/2024    2:00 PM  Results of the Epworth flowsheet  Sitting and reading 0  Watching TV 0  Sitting, inactive in a public place (e.g. a theatre or a meeting) 0  As a passenger in a car for an hour without a break 0  Lying down to rest in the afternoon when circumstances permit 1  Sitting and talking to someone 0  Sitting quietly after a lunch without alcohol 0  In a car, while stopped for a few minutes in traffic 0  Total score 1    Allergies: Patient has no known allergies.  Current Outpatient Medications:    amLODipine (NORVASC) 5 MG tablet, Take 5 mg by mouth daily., Disp: , Rfl:    aspirin EC 81 MG tablet, Take 81 mg by mouth daily., Disp: , Rfl:    atorvastatin (LIPITOR) 20 MG tablet, Take 20 mg by mouth daily., Disp: , Rfl:    losartan -hydrochlorothiazide  (HYZAAR) 100-12.5 MG tablet, Take 1 tablet by mouth at bedtime., Disp: , Rfl:  Past Medical History:  Diagnosis Date   Anemia    Fibroids    uterine   Hypertension    Past Surgical History:  Procedure Laterality Date   APPENDECTOMY     CESAREAN SECTION     CHOLECYSTECTOMY     COLONOSCOPY     hx polyp   HYSTERECTOMY ABDOMINAL WITH SALPINGO-OOPHORECTOMY  03/11/2018   Procedure: TOTAL ABDOMINAL HYSTERECTOMY WITH BILATERAL SALPINGO-OOPHORECTOMY;  Surgeon: Marget Lenis, MD;  Location: [facility]  ORS;  Service: Gynecology;;   LAPAROSCOPY  03/11/2018   Procedure: DIAGNOSTIC LAPAROSCOPY;  Surgeon: Marget Lenis, MD;  Location: [facility] ORS;  Service: Gynecology;;   LYSIS OF ADHESION  03/11/2018   Procedure: LYSIS OF ADHESIONS;  Surgeon: Marget Lenis, MD;  Location: [facility] ORS;  Service: Gynecology;;   SHOULDER SURGERY Right    Family History  Problem Relation Age of Onset   Breast cancer Maternal Aunt    Social History   Socioeconomic History   Marital status: Divorced    Spouse name: Not on file   Number of children: 1   Years of education: Not on file   Highest education level: Not on file  Occupational History    Not on file  Tobacco Use   Smoking status: Former    Current packs/day: 0.00    Average packs/day: 0.3 packs/day for 2.0 years (0.5 ttl pk-yrs)    Types: Cigarettes    Start date: 03/14/1995    Quit date: 03/13/1997    Years since quitting: 26.8   Smokeless tobacco: Never  Vaping Use   Vaping status: Never Used  Substance and Sexual Activity   Alcohol use: Not Currently    Alcohol/week: 3.0 standard drinks of alcohol    Types: 3 Cans of beer per week   Drug use: Never   Sexual activity: Not Currently    Birth control/protection: None  Other Topics Concern   Not on file  Social History Narrative   Not on file   Social Drivers of Health   Financial Resource Strain: Low Risk (09/27/2023)   Received from [facility]   Overall Financial Resource Strain (CARDIA)    How hard is it for you to pay for the very basics like food, housing, medical care, and heating?: Not hard at all  Food Insecurity: No Food Insecurity (09/27/2023)   Received from [facility]   Hunger Vital Sign    Within the past 12 months, you worried that your food would run out before you got the money to buy more.: Never true    Within the past 12 months, the food you bought just didn't last and you didn't have money to get more.: Never true  Transportation Needs: No Transportation Needs (09/27/2023)   Received from [facility]   PRAPARE - Transportation    Lack of Transportation (Medical): No    Lack of Transportation (Non-Medical): No  Physical Activity: Sufficiently Active (09/27/2023)   Received from [facility]   Exercise Vital Sign    On average, how many days per week do you engage in moderate to strenuous exercise (like a brisk walk)?: 7 days    On average, how many minutes do you engage in exercise at this level?: 30 min  Stress: Stress Concern Present (09/27/2023)   Received from [facility] - Occupational Stress Questionnaire    Do you feel  stress - tense, restless, nervous, or anxious, or unable to sleep at night because your mind is troubled all the time - these days?: Very much  Social Connections: Moderately Integrated (09/27/2023)   Received from [facility]   Social Connection and Isolation Panel    In a typical week, how many times do you talk on the phone with family, friends, or neighbors?: More than three times a week    How often do you get together with friends or relatives?: More than three times a week  How often do you attend church or religious services?: 1 to 4 times per year    Do you belong to any clubs or organizations such as church groups, unions, fraternal or athletic groups, or school groups?: Yes    How often do you attend meetings of the clubs or organizations you belong to?: 1 to 4 times per year    Are you married, widowed, divorced, separated, never married, or living with a partner?: Divorced  Intimate Partner Violence: Not At Risk (09/27/2023)   Received from [facility]   Humiliation, Afraid, Rape, and Kick questionnaire    Within the last year, have you been afraid of your partner or ex-partner?: No    Within the last year, have you been humiliated or emotionally abused in other ways by your partner or ex-partner?: No    Within the last year, have you been kicked, hit, slapped, or otherwise physically hurt by your partner or ex-partner?: No    Within the last year, have you been raped or forced to have any kind of sexual activity by your partner or ex-partner?: No       Objective:  BP 118/76   Pulse 80   Temp 97.8 F (36.6 C) (Oral)   Ht 5' 6 (1.676 m)   Wt 198 lb (89.8 kg)   LMP 01/28/2018 (Exact Date)   SpO2 100% Comment: room air  BMI 31.96 kg/m  BMI Readings from Last 3 Encounters:  01/28/24 31.96 kg/m  02/05/23 30.54 kg/m  07/12/21 30.04 kg/m    Physical Exam: Physical Exam   ENT: Normal mucosa. No hypertrophy of inferior turbinates. Tonsils are normal sized.  Modified Mallampati score is 1. Airways appear open. PULMONARY: Lungs clear to auscultation bilaterally, no adventitious breath sounds. CARDIOVASCULAR: Regular rate and rhythm, S1 S2 normal, no murmurs. ABDOMEN: Abdomen soft, nontender. Bowel sounds are normal. EXTREMITIES: No peripheral edema noted.       Diagnostic Review:  Last metabolic panel Lab Results  Component Value Date   GLUCOSE 101 (H) 02/05/2023   NA 140 02/05/2023   K 4.0 02/05/2023   CL 111 02/05/2023   CO2 23 02/05/2023   BUN 13 02/05/2023   CREATININE 0.70 02/05/2023   GFRNONAA >60 02/05/2023   CALCIUM 9.5 02/05/2023   PROT 7.2 01/23/2019   ALBUMIN 4.3 01/23/2019   BILITOT 0.6 01/23/2019   ALKPHOS 128 (H) 01/23/2019   AST 11 (L) 01/23/2019   ALT 19 01/23/2019   ANIONGAP 6 02/05/2023         Assessment & Plan:  Assessment and Plan    Suspected obstructive sleep apnea with snoring and fatigue Suspected obstructive sleep apnea based on snoring, fatigue, dry mouth, and nocturnal gasping. Differential diagnosis includes obstructive sleep apnea. - Ordered home sleep study to confirm diagnosis. If neg will order NPSG.  - Advised side sleeping to alleviate symptoms. - Plan to initiate CPAP therapy if sleep apnea is confirmed.     She was counselled about not driving while drowsy which is common side effect of sleep related disorders.   Return for 2 months after sleep study.   I personally spent a total of 25 minutes in the care of the patient today including preparing to see the patient, getting/reviewing separately obtained history, performing a medically appropriate exam/evaluation, counseling and educating, placing orders, documenting clinical information in the EHR, independently interpreting results, and communicating results.   Keyonna Comunale, MD

## 2024-02-08 ENCOUNTER — Encounter

## 2024-02-08 DIAGNOSIS — R5383 Other fatigue: Secondary | ICD-10-CM

## 2024-02-08 DIAGNOSIS — R0683 Snoring: Secondary | ICD-10-CM

## 2024-02-19 ENCOUNTER — Telehealth: Payer: Self-pay

## 2024-02-19 DIAGNOSIS — G4733 Obstructive sleep apnea (adult) (pediatric): Secondary | ICD-10-CM | POA: Diagnosis not present

## 2024-02-19 NOTE — Telephone Encounter (Signed)
 Date of Study: 02/08/24  Interpretation: atleast Moderate OSA with AHI of 13.1 but oximetry was not present for the entirety of sleep study.   Plan: Initiate auto CPAP at 8-15 cm H2O and provide supplies.  Will order nocturnal oximetry after CPAP setup to evaluate oxygenation Side sleep.   Sammi Fredericks, MD.

## 2024-02-21 NOTE — Telephone Encounter (Signed)
ATC x 1 

## 2024-02-22 NOTE — Telephone Encounter (Signed)
 Pt called back. I spoke to pt and advised of sleep study results per Dr. Theodoro. Pt verbalized understanding and expressed that she would like to proceed with CPAP therapy. CPAP order placed.  Pt has f/u appt on 02/12/25 with Dr. Pawar. Pt was advised to keep this appointment for CPAP compliance check-in. NFN

## 2024-02-22 NOTE — Telephone Encounter (Signed)
 Copied from CRM #[id number]. Topic: Clinical - Lab/Test Results >> Feb 22, 2024 11:01 AM Isabell A wrote: Reason for CRM: Patient returning phone call from [facility] for sleep study results.   Callback number: [phone]   [id number]

## 2024-02-22 NOTE — Addendum Note (Signed)
 Addended byBETHA JESSICA BOUCHARD S on: 02/22/2024 11:24 AM   Modules accepted: Orders

## 2024-03-10 ENCOUNTER — Telehealth: Payer: Self-pay

## 2024-03-10 NOTE — Telephone Encounter (Signed)
 Received CMN from [facility] for CPAP supplies. Placed in Dr. Lavena sign folder for his signature.    Once signed, will fax back to [place] at [phone]

## 2024-04-15 ENCOUNTER — Ambulatory Visit

## 2024-05-06 ENCOUNTER — Ambulatory Visit
